# Patient Record
Sex: Male | Born: 1965 | Race: White | Hispanic: No | Marital: Single | Smoking: Never smoker
Health system: Southern US, Community
[De-identification: ages and names within clinical notes are randomized; demographics above are authoritative.]

## PROBLEM LIST (undated history)

## (undated) DIAGNOSIS — R42 Dizziness and giddiness: Secondary | ICD-10-CM

## (undated) DIAGNOSIS — F419 Anxiety disorder, unspecified: Secondary | ICD-10-CM

## (undated) DIAGNOSIS — K7460 Unspecified cirrhosis of liver: Secondary | ICD-10-CM

## (undated) DIAGNOSIS — I10 Essential (primary) hypertension: Secondary | ICD-10-CM

---

## 2014-02-25 ENCOUNTER — Emergency Department: Payer: Self-pay | Admitting: Emergency Medicine

## 2014-10-17 ENCOUNTER — Inpatient Hospital Stay
Admission: EM | Admit: 2014-10-17 | Discharge: 2014-10-19 | DRG: 434 | Disposition: A | Discharge status: Home / Self Care | Payer: BLUE CROSS/BLUE SHIELD | Attending: Internal Medicine | Admitting: Internal Medicine

## 2014-10-17 ENCOUNTER — Emergency Department: Payer: BLUE CROSS/BLUE SHIELD

## 2014-10-17 ENCOUNTER — Encounter: Payer: Self-pay | Admitting: Emergency Medicine

## 2014-10-17 DIAGNOSIS — F419 Anxiety disorder, unspecified: Secondary | ICD-10-CM | POA: Diagnosis present

## 2014-10-17 DIAGNOSIS — Z800 Family history of malignant neoplasm of digestive organs: Secondary | ICD-10-CM | POA: Diagnosis not present

## 2014-10-17 DIAGNOSIS — Z8249 Family history of ischemic heart disease and other diseases of the circulatory system: Secondary | ICD-10-CM

## 2014-10-17 DIAGNOSIS — Z79899 Other long term (current) drug therapy: Secondary | ICD-10-CM | POA: Diagnosis not present

## 2014-10-17 DIAGNOSIS — F1010 Alcohol abuse, uncomplicated: Secondary | ICD-10-CM | POA: Diagnosis present

## 2014-10-17 DIAGNOSIS — K8020 Calculus of gallbladder without cholecystitis without obstruction: Secondary | ICD-10-CM | POA: Diagnosis present

## 2014-10-17 DIAGNOSIS — K7460 Unspecified cirrhosis of liver: Secondary | ICD-10-CM | POA: Diagnosis present

## 2014-10-17 DIAGNOSIS — D1803 Hemangioma of intra-abdominal structures: Secondary | ICD-10-CM | POA: Diagnosis present

## 2014-10-17 DIAGNOSIS — I10 Essential (primary) hypertension: Secondary | ICD-10-CM | POA: Diagnosis present

## 2014-10-17 DIAGNOSIS — K7031 Alcoholic cirrhosis of liver with ascites: Secondary | ICD-10-CM | POA: Diagnosis present

## 2014-10-17 DIAGNOSIS — D6959 Other secondary thrombocytopenia: Secondary | ICD-10-CM | POA: Diagnosis present

## 2014-10-17 DIAGNOSIS — R188 Other ascites: Secondary | ICD-10-CM | POA: Diagnosis present

## 2014-10-17 DIAGNOSIS — F4010 Social phobia, unspecified: Secondary | ICD-10-CM | POA: Diagnosis present

## 2014-10-17 HISTORY — DX: Anxiety disorder, unspecified: F41.9

## 2014-10-17 HISTORY — DX: Unspecified cirrhosis of liver: K74.60

## 2014-10-17 HISTORY — DX: Dizziness and giddiness: R42

## 2014-10-17 HISTORY — DX: Essential (primary) hypertension: I10

## 2014-10-17 LAB — COMPREHENSIVE METABOLIC PANEL
ALBUMIN: 3.5 g/dL (ref 3.5–5.0)
ALT: 35 U/L (ref 17–63)
ALT: 32 U/L (ref 17–63)
AST: 86 U/L — AB (ref 15–41)
AST: 79 U/L — AB (ref 15–41)
Albumin: 3.9 g/dL (ref 3.5–5.0)
Alkaline Phosphatase: 134 U/L — ABNORMAL HIGH (ref 38–126)
Alkaline Phosphatase: 117 U/L (ref 38–126)
Anion gap: 10 (ref 5–15)
Anion gap: 9 (ref 5–15)
BUN: 7 mg/dL (ref 6–20)
BUN: 7 mg/dL (ref 6–20)
CHLORIDE: 104 mmol/L (ref 101–111)
CHLORIDE: 103 mmol/L (ref 101–111)
CO2: 24 mmol/L (ref 22–32)
CO2: 25 mmol/L (ref 22–32)
CREATININE: 0.69 mg/dL (ref 0.61–1.24)
CREATININE: 0.7 mg/dL (ref 0.61–1.24)
Calcium: 9.2 mg/dL (ref 8.9–10.3)
Calcium: 8.8 mg/dL — ABNORMAL LOW (ref 8.9–10.3)
GFR calc Af Amer: >60 mL/min (ref >60)
GFR calc non Af Amer: >60 mL/min (ref >60)
GFR calc non Af Amer: >60 mL/min (ref >60)
GLUCOSE: 98 mg/dL (ref 65–99)
Glucose, Bld: 104 mg/dL — ABNORMAL HIGH (ref 65–99)
Potassium: 3.7 mmol/L (ref 3.5–5.1)
Potassium: 3.8 mmol/L (ref 3.5–5.1)
SODIUM: 138 mmol/L (ref 135–145)
SODIUM: 137 mmol/L (ref 135–145)
Total Bilirubin: 5.4 mg/dL — ABNORMAL HIGH (ref 0.3–1.2)
Total Bilirubin: 4.9 mg/dL — ABNORMAL HIGH (ref 0.3–1.2)
Total Protein: 7.1 g/dL (ref 6.5–8.1)
Total Protein: 6.5 g/dL (ref 6.5–8.1)

## 2014-10-17 LAB — CBC WITH DIFFERENTIAL/PLATELET
BASOS ABS: 0.1 10*3/uL (ref 0–0.1)
EOS ABS: 0.1 10*3/uL (ref 0–0.7)
HCT: 48 % (ref 40.0–52.0)
Hemoglobin: 16.2 g/dL (ref 13.0–18.0)
Lymphocytes Relative: 28 %
Lymphs Abs: 2.4 10*3/uL (ref 1.0–3.6)
MCH: 33.6 pg (ref 26.0–34.0)
MCHC: 33.7 g/dL (ref 32.0–36.0)
MCV: 99.8 fL (ref 80.0–100.0)
Monocytes Absolute: 1.2 10*3/uL — ABNORMAL HIGH (ref 0.2–1.0)
Monocytes Relative: 14 %
Neutro Abs: 4.8 10*3/uL (ref 1.4–6.5)
Neutrophils Relative %: 56 %
PLATELETS: 97 10*3/uL — AB (ref 150–440)
RBC: 4.81 MIL/uL (ref 4.40–5.90)
RDW: 14.9 % — ABNORMAL HIGH (ref 11.5–14.5)
WBC: 8.6 10*3/uL (ref 3.8–10.6)

## 2014-10-17 LAB — CBC
HCT: 47.3 % (ref 40.0–52.0)
Hemoglobin: 15.7 g/dL (ref 13.0–18.0)
MCH: 33.4 pg (ref 26.0–34.0)
MCHC: 33.3 g/dL (ref 32.0–36.0)
MCV: 100.4 fL — AB (ref 80.0–100.0)
PLATELETS: 92 10*3/uL — AB (ref 150–440)
RBC: 4.71 MIL/uL (ref 4.40–5.90)
RDW: 15 % — ABNORMAL HIGH (ref 11.5–14.5)
WBC: 8.9 10*3/uL (ref 3.8–10.6)

## 2014-10-17 LAB — ETHANOL

## 2014-10-17 LAB — LIPASE, BLOOD: Lipase: 51 U/L (ref 22–51)

## 2014-10-17 LAB — APTT: APTT: 39 s — AB (ref 24–36)

## 2014-10-17 LAB — PROTIME-INR
INR: 1.27
Prothrombin Time: 16.1 seconds — ABNORMAL HIGH (ref 11.4–15.0)

## 2014-10-17 LAB — MAGNESIUM: Magnesium: 1.9 mg/dL (ref 1.7–2.4)

## 2014-10-17 MED ORDER — OXYCODONE HCL 5 MG PO TABS
5.0000 mg | ORAL_TABLET | ORAL | Status: DC | PRN
  Administered 2014-10-17 – 2014-10-18 (×3): 5 mg via ORAL
  Filled 2014-10-17 (×3): qty 1

## 2014-10-17 MED ORDER — MORPHINE SULFATE (PF) 2 MG/ML IV SOLN: 2.0000 mg | INTRAVENOUS | Status: DC | PRN

## 2014-10-17 MED ORDER — ALPRAZOLAM 0.25 MG PO TABS
0.2500 mg | ORAL_TABLET | Freq: Once | ORAL | Status: AC
  Administered 2014-10-17: 0.25 mg via ORAL

## 2014-10-17 MED ORDER — ALPRAZOLAM 0.25 MG PO TABS
0.2500 mg | ORAL_TABLET | Freq: Every evening | ORAL | Status: DC | PRN
  Filled 2014-10-17: qty 1

## 2014-10-17 MED ORDER — FUROSEMIDE 40 MG PO TABS
40.0000 mg | ORAL_TABLET | Freq: Every day | ORAL | Status: DC
  Administered 2014-10-18 – 2014-10-19 (×2): 40 mg via ORAL
  Filled 2014-10-17 (×3): qty 1

## 2014-10-17 MED ORDER — FOLIC ACID 1 MG PO TABS
1.0000 mg | ORAL_TABLET | Freq: Every day | ORAL | Status: DC
  Administered 2014-10-18 – 2014-10-19 (×2): 1 mg via ORAL
  Filled 2014-10-17 (×3): qty 1

## 2014-10-17 MED ORDER — LOSARTAN POTASSIUM 25 MG PO TABS
25.0000 mg | ORAL_TABLET | Freq: Every day | ORAL | Status: DC
Start: 2014-10-17 — End: 2014-10-18
  Administered 2014-10-18: 25 mg via ORAL
  Filled 2014-10-17 (×2): qty 1

## 2014-10-17 MED ORDER — ADULT MULTIVITAMIN W/MINERALS CH
1.0000 | ORAL_TABLET | Freq: Every day | ORAL | Status: DC
Start: 2014-10-17 — End: 2014-10-19
  Administered 2014-10-18 – 2014-10-19 (×2): 1 via ORAL
  Filled 2014-10-17 (×2): qty 1

## 2014-10-17 MED ORDER — IOHEXOL 240 MG/ML SOLN
25.0000 mL | Freq: Once | INTRAMUSCULAR | Status: AC | PRN
  Administered 2014-10-17: 25 mL via ORAL

## 2014-10-17 MED ORDER — IOHEXOL 300 MG/ML  SOLN
100.0000 mL | Freq: Once | INTRAMUSCULAR | Status: AC | PRN
  Administered 2014-10-17: 100 mL via INTRAVENOUS

## 2014-10-17 MED ORDER — ALPRAZOLAM 0.5 MG PO TABS
ORAL_TABLET | ORAL | Status: AC
  Administered 2014-10-17: 0.25 mg via ORAL
  Filled 2014-10-17: qty 1

## 2014-10-17 MED ORDER — SENNOSIDES-DOCUSATE SODIUM 8.6-50 MG PO TABS: 1.0000 | ORAL_TABLET | Freq: Every evening | ORAL | Status: DC | PRN

## 2014-10-17 NOTE — Progress Notes (Signed)
Pt just arrived on floor. Rating pain 6/10 lower abdominal pain r/t ascites diagnosis. No pain medication ordered. On call MD notified, acknowledged, & will place order for PRN pain medication.

## 2014-10-17 NOTE — ED Notes (Signed)
MD at bedside. 

## 2014-10-17 NOTE — ED Notes (Signed)
Brought over from [facility] with abd swelling ..swelling noted on sat

## 2014-10-17 NOTE — H&P (Signed)
[facility] [place] NAME: Ronnie Cohen    MR#:  [id number]  DATE OF BIRTH:  1965/12/25  DATE OF ADMISSION:  10/17/2014  PRIMARY CARE PHYSICIAN: Pcp Not In System   REQUESTING/REFERRING PHYSICIAN: Dr.Forbach  CHIEF COMPLAINT: Increased abdominal bloating, jaundice.    Chief Complaint  Patient presents with  . Abdominal Pain    HISTORY OF PRESENT ILLNESS:  Ronnie Cohen  is a 49 y.o. male with a known history of hypertension, anxiety brought in by the family secondary to abdominal bloating, worsening the leg swelling, weight gain of 20 pounds in 2-3 weeks. According to the patient he has been having the abdominal swelling since January but got worse recently. Patient went to Dr. Gustavo Lah 's office yesterday he did ultrasound of abdomen which showed hepatic cirrhosis and ascites multiple gallstones and a nodule in the left hepatic lobe likely hemangioma. Concerning the ascites jaundice weight gain family brought him in here. Patient denies abdominal pain. Does have some nausea today. No fever. No recent travel. Patient is uncomfortable because of fall abdominal bloating and stretching. Patient tried some icy hot to help with this. Patient has no black stools. Denies any fever. No night sweats, chills, dysuria. Patient went to Dr. Gustavo Lah  office and he was sent in here for further evaluation.  PAST MEDICAL HISTORY:   Past Medical History  Diagnosis Date  . Hypertension   . Anxiety   . Vertigo     PAST SURGICAL HISTOIRY:  History reviewed. No pertinent past surgical history.  SOCIAL HISTORY:   Social History  Substance Use Topics  . Smoking status: Never Smoker   . Smokeless tobacco: Not on file  . Alcohol Use: Yes    FAMILY HISTORY:  No family history on file.  DRUG ALLERGIES:  No Known Allergies  REVIEW OF SYSTEMS:  CONSTITUTIONAL: No fever, fatigue or weakness.  EYES: No blurred or double vision.  EARS, NOSE, AND  THROAT: No tinnitus or ear pain.  RESPIRATORY: No cough, shortness of breath, wheezing or hemoptysis.  CARDIOVASCULAR: No chest pain, orthopnea, edema.  GASTROINTESTINAL ascites, nausea, weight gain, pedal edema present GENITOURINARY: No dysuria, hematuria.  ENDOCRINE: No polyuria, nocturia,  HEMATOLOGY: No anemia, easy bruising or bleeding SKIN: No rash or lesion. MUSCULOSKELETAL: No joint pain or arthritis.   NEUROLOGIC: No tingling, numbness, weakness.  PSYCHIATRY: No anxiety or depression.   MEDICATIONS AT HOME:   Prior to Admission medications   Medication Sig Start Date End Date Taking? Authorizing Provider  ALPRAZolam Duanne Moron) 0.25 MG tablet Take 1 tablet by mouth daily as needed. 10/10/14  Yes Historical Provider, MD  losartan (COZAAR) 25 MG tablet Take 1 tablet by mouth daily. 10/04/14  Yes Historical Provider, MD  Multiple Vitamin (MULTIVITAMIN WITH MINERALS) TABS tablet Take 1 tablet by mouth daily.   Yes Historical Provider, MD      VITAL SIGNS:  Blood pressure 136/98, pulse 102, temperature 98.8 F (37.1 C), temperature source Oral, resp. rate 20, height 6\' 1"  (1.854 m), weight 123.378 kg (272 lb), SpO2 93 %.  PHYSICAL EXAMINATION:  GENERAL:  49 y.o.-year-old patient lying in the bed with no acute distress.  EYES: Pupils equal, round, reactive to light and accommodation. Icterus present Extraocular muscles intact.  HEENT: Head atraumatic, normocephalic. Oropharynx and nasopharynx clear.  NECK:  Supple, no jugular venous distention. No thyroid enlargement, no tenderness.  LUNGS: Diminished Breath sounds bilaterally.  CARDIOVASCULAR: S1, S2 normal. No murmurs, rubs, or gallops.  ABDOMEN: Ascites, fluid thrill present no tenderness to palpation.Marland Kitchen  EXTREMITIES: No pedal edema, cyanosis, or clubbing.  NEUROLOGIC: Cranial nerves II through XII are intact. Muscle strength 5/5 in all extremities. Sensation intact. Gait not checked.  PSYCHIATRIC: The patient is alert and oriented  x 3.  SKIN: No obvious rash, lesion, or ulcer.   LABORATORY PANEL:   CBC  Recent Labs Lab 10/17/14 1449  WBC 8.6  HGB 16.2  HCT 48.0  PLT 97*   ------------------------------------------------------------------------------------------------------------------  Chemistries   Recent Labs Lab 10/17/14 1449  NA 138  K 3.7  CL 104  CO2 24  GLUCOSE 104*  BUN 7  CREATININE 0.69  CALCIUM 9.2  MG 1.9  AST 86*  ALT 35  ALKPHOS 134*  BILITOT 5.4*   ------------------------------------------------------------------------------------------------------------------  Cardiac Enzymes No results for input(s): TROPONINI in the last 168 hours. ------------------------------------------------------------------------------------------------------------------  RADIOLOGY:  Ct Abdomen Pelvis W Contrast  10/17/2014   CLINICAL DATA:  Generalized abdominal distention and swelling since Saturday.  EXAM: CT ABDOMEN AND PELVIS WITH CONTRAST  TECHNIQUE: Multidetector CT imaging of the abdomen and pelvis was performed using the standard protocol following bolus administration of intravenous contrast.  CONTRAST:  113mL OMNIPAQUE IOHEXOL 300 MG/ML  SOLN  COMPARISON:  None.  FINDINGS: The there is moderate to marked ascites in the abdomen and pelvis.  No focal liver lesion is identified. The spleen, pancreas, adrenal glands and kidneys are normal. There is a gallstone in the gallbladder. The aorta is normal without aneurysmal dilatation. There is no abdominal lymphadenopathy. There is no small bowel obstruction or diverticulitis. There is mild umbilical herniation of ascites and mesenteric fat.  Partial fluid-filled bladder is normal. Ascites is identified in the pelvis. There is mild atelectasis of the bilateral lung bases. Degenerative joint changes of the spine are noted.  IMPRESSION: Moderate-to-marked ascites in the abdomen and pelvis.  No other acute abnormalities identified in the abdomen and pelvis.    Electronically Signed   By: Abelardo Diesel M.D.   On: 10/17/2014 17:25    EKG:  No orders found for this or any previous visit.  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 But to rule out other causes like hepatitis, Primary biliary cirrhosis  ,autoimmune liver disease. Admit him to hospitalist service, GI consult requested:, #2 ascites: Interventional radiology to help with paracentesis for diagnostic, therapeutic paracentesis. I don't think he has SBP at this time hold off on Antibiotics.. #3 history of alcohol abuse patient says that he does not drink heavily right now he is not having any withdrawal, last drink was last Friday. #4 anxiety continue Xanax. #5 hypertension patient is on losartan restarted that. T thrombocytopenia secondary to cirrhosis. All the records are reviewed and case discussed with ED provider. Management plans discussed with the patient, family and they are in agreement.  CODE STATUS: full  TOTAL TIME TAKING CARE OF THIS PATIENT: *55 minutes.    Epifanio Lesches M.D on 10/17/2014 at 7:12 PM  Between 7am to 6pm - Pager - [phone]  After 6pm go to [email or web address] - password [email or web address] [place]  Office  [phone]  CC: Primary care physician; Pcp Not In System

## 2014-10-17 NOTE — ED Provider Notes (Signed)
[facility] Emergency Department Provider Note  ____________________________________________  Time seen: Approximately 2:13 PM  I have reviewed the triage vital signs and the nursing notes.   HISTORY  Chief Complaint Abdominal Pain    HPI Ronnie Cohen is a 49 y.o. male with a history of hypertension, anxiety, and regular moderate to heavy alcohol use who presents with substantial abdominal distention which has occurred all in the last 6 days.  It has been a gradual but rapid onset and is now severe.  He denies any pain other than the pain of the swollen abdomen, but he is not tender to palpation.  He has had no nausea, vomiting, chest pain, shortness of breath, or dysuria.  Never had symptoms like this in the past.  His primary care doctor is doctors making house calls and they originally ordered some labs and ultrasound.  His girlfriend helped him get in to see the [place] clinic earlier today and his labs were abnormal and his ultrasound had a nonspecific abnormality with hepatic cirrhosis and ascites.  Reportedly his total bilirubin was elevated substantially today.  He was sent to the emergency department for further evaluation.  The patient reports he drinks anywhere between 2-4 glasses of wine nightly and occasionally has additional beer on the weekends.  This is been consistent for years.  He recently started taking losartan for blood pressure about 1 month ago but has had no other medication changes.  He denies any recent fever or chills and generally feels the same except for his swollen abdomen.   Past Medical History  Diagnosis Date  . Hypertension   . Anxiety   . Vertigo     Patient Active Problem List   Diagnosis Date Noted  . Ascites 10/17/2014    History reviewed. No pertinent past surgical history.  No current outpatient prescriptions on file.  Allergies Review of patient's allergies indicates no known allergies.  Family History   Problem Relation Age of Onset  . Hypertension Father     Social History Social History  Substance Use Topics  . Smoking status: Never Smoker   . Smokeless tobacco: None  . Alcohol Use: 15.0 oz/week    25 Standard drinks or equivalent per week    Review of Systems Constitutional: No fever/chills Eyes: No visual changes. ENT: No sore throat. Cardiovascular: Denies chest pain. Respiratory: Denies shortness of breath. Gastrointestinal: No abdominal pain.  Severe abdominal swelling.  No nausea, no vomiting.  No diarrhea.  No constipation. Genitourinary: Negative for dysuria. Musculoskeletal: Negative for back pain. Skin: Negative for rash. Neurological: Negative for headaches, focal weakness or numbness.  10-point ROS otherwise negative.  ____________________________________________   PHYSICAL EXAM:  VITAL SIGNS: ED Triage Vitals  Enc Vitals Group     BP 10/17/14 1351 136/98 mmHg     Pulse Rate 10/17/14 1351 102     Resp 10/17/14 1351 20     Temp 10/17/14 1351 98.8 F (37.1 C)     Temp Source 10/17/14 1351 Oral     SpO2 10/17/14 1351 93 %     Weight 10/17/14 1351 272 lb (123.378 kg)     Height 10/17/14 1351 6\' 1"  (1.854 m)     Head Cir --      Peak Flow --      Pain Score 10/17/14 1353 2     Pain Loc --      Pain Edu? --      Excl. in [place]? --  Constitutional: Alert and oriented. Well appearing and in no acute distress. Eyes: Conjunctivae are normal. PERRL. EOMI. Head: Atraumatic. Nose: No congestion/rhinnorhea. Mouth/Throat: Mucous membranes are moist.  Oropharynx non-erythematous. Neck: No stridor.   Cardiovascular: Normal rate, regular rhythm. Grossly normal heart sounds.  Good peripheral circulation. Respiratory: Normal respiratory effort.  No retractions. Lungs CTAB. Gastrointestinal: Firm, distended abdomen with dullness to percussion.  No tenderness to palpation. Musculoskeletal: No lower extremity tenderness nor edema.  No joint  effusions. Neurologic:  Normal speech and language. No gross focal neurologic deficits are appreciated.  Skin:  Skin is warm, dry and intact. No rash noted. Psychiatric: Mood and affect are normal. Speech and behavior are normal.  ____________________________________________   LABS (all labs ordered are listed, but only abnormal results are displayed)  Labs Reviewed  CBC WITH DIFFERENTIAL/PLATELET - Abnormal; Notable for the following:    RDW 14.9 (*)    Platelets 97 (*)    Monocytes Absolute 1.2 (*)    All other components within normal limits  COMPREHENSIVE METABOLIC PANEL - Abnormal; Notable for the following:    Glucose, Bld 104 (*)    AST 86 (*)    Alkaline Phosphatase 134 (*)    Total Bilirubin 5.4 (*)    All other components within normal limits  PROTIME-INR - Abnormal; Notable for the following:    Prothrombin Time 16.1 (*)    All other components within normal limits  APTT - Abnormal; Notable for the following:    aPTT 39 (*)    All other components within normal limits  CBC - Abnormal; Notable for the following:    MCV 100.4 (*)    RDW 15.0 (*)    Platelets 92 (*)    All other components within normal limits  COMPREHENSIVE METABOLIC PANEL - Abnormal; Notable for the following:    Calcium 8.8 (*)    AST 79 (*)    Total Bilirubin 4.9 (*)    All other components within normal limits  LIPASE, BLOOD  ETHANOL  MAGNESIUM  HEPATITIS B SURFACE ANTIGEN  HEPATITIS PANEL, ACUTE   ____________________________________________  EKG  Not indicated ____________________________________________  RADIOLOGY  Ct Abdomen Pelvis W Contrast  10/17/2014   CLINICAL DATA:  Generalized abdominal distention and swelling since Saturday.  EXAM: CT ABDOMEN AND PELVIS WITH CONTRAST  TECHNIQUE: Multidetector CT imaging of the abdomen and pelvis was performed using the standard protocol following bolus administration of intravenous contrast.  CONTRAST:  157mL OMNIPAQUE IOHEXOL 300 MG/ML   SOLN  COMPARISON:  None.  FINDINGS: The there is moderate to marked ascites in the abdomen and pelvis.  No focal liver lesion is identified. The spleen, pancreas, adrenal glands and kidneys are normal. There is a gallstone in the gallbladder. The aorta is normal without aneurysmal dilatation. There is no abdominal lymphadenopathy. There is no small bowel obstruction or diverticulitis. There is mild umbilical herniation of ascites and mesenteric fat.  Partial fluid-filled bladder is normal. Ascites is identified in the pelvis. There is mild atelectasis of the bilateral lung bases. Degenerative joint changes of the spine are noted.  IMPRESSION: Moderate-to-marked ascites in the abdomen and pelvis.  No other acute abnormalities identified in the abdomen and pelvis.   Electronically Signed   By: Abelardo Diesel M.D.   On: 10/17/2014 17:25     ____________________________________________   PROCEDURES  Procedure(s) performed: None  Critical Care performed: No ____________________________________________   INITIAL IMPRESSION / ASSESSMENT AND PLAN / ED COURSE  Pertinent labs & imaging results  that were available during my care of the patient were reviewed by me and considered in my medical decision making (see chart for details).  Given the rapid onset of the symptoms I feel it is important to obtain a CT of his abdomen to look both for neoplastic disease as well as other possible causes such as portal vein thrombosis.  He is nontoxic and in no acute distress and I am not concerned for spontaneous bacterial peritonitis at this time.  I anticipate admission for large volume paracentesis and further GI workup.  ----------------------------------------- 4:42 PM on 10/17/2014 -----------------------------------------  The patient's labs are notable for significantly elevated total bilirubin and slightly elevated coagulation studies.  Going for CT scan now.  Dr. Gustavo Lah called to discuss the case with me  because his 84 assistant was the one who saw the patient in clinic.  He agreed that admission is necessary and he agreed with my plan for the CT scan to rule out other possibilities.  He suggested that the hospitalist put in a consult for him and his 27 assistant would put a note in the computer as well.  ----------------------------------------- 5:44 PM on 10/17/2014 -----------------------------------------  The patient's CT scan was generally unremarkable with no evidence of thrombosis or other emergent condition.  However, he has a total bilirubin of 5.4 and as per my discussion with Dr. Gustavo Lah I will admit him to the hospitalist for GI consult and further evaluation as well as for a large volume paracentesis and evaluation of the ascites.  ____________________________________________  FINAL CLINICAL IMPRESSION(S) / ED DIAGNOSES  Final diagnoses:  Ascites  Hyperbilirubinemia      NEW MEDICATIONS STARTED DURING THIS VISIT:  Current Discharge Medication List       Hinda Kehr, MD 10/17/14 2034

## 2014-10-18 ENCOUNTER — Inpatient Hospital Stay: Payer: BLUE CROSS/BLUE SHIELD

## 2014-10-18 ENCOUNTER — Encounter: Payer: Self-pay | Admitting: Student

## 2014-10-18 DIAGNOSIS — K7460 Unspecified cirrhosis of liver: Secondary | ICD-10-CM

## 2014-10-18 HISTORY — DX: Unspecified cirrhosis of liver: K74.60

## 2014-10-18 LAB — BODY FLUID CELL COUNT WITH DIFFERENTIAL
EOS FL: 0 %
Lymphs, Fluid: 34 %
MONOCYTE-MACROPHAGE-SEROUS FLUID: 59 % (ref 50–90)
Neutrophil Count, Fluid: 7 % (ref 0–25)
Other Cells, Fluid: 0 %
Total Nucleated Cell Count, Fluid: 280 cu mm (ref 0–1000)

## 2014-10-18 LAB — PROTEIN, BODY FLUID

## 2014-10-18 LAB — GLUCOSE, SEROUS FLUID: Glucose, Fluid: 115 mg/dL

## 2014-10-18 MED ORDER — VITAMIN B-1 100 MG PO TABS
100.0000 mg | ORAL_TABLET | Freq: Every day | ORAL | Status: DC
  Administered 2014-10-18 – 2014-10-19 (×2): 100 mg via ORAL
  Filled 2014-10-18 (×2): qty 1

## 2014-10-18 MED ORDER — SPIRONOLACTONE 25 MG PO TABS
50.0000 mg | ORAL_TABLET | Freq: Every day | ORAL | Status: DC
  Administered 2014-10-18 – 2014-10-19 (×2): 50 mg via ORAL
  Filled 2014-10-18 (×2): qty 2

## 2014-10-18 NOTE — Plan of Care (Signed)
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  Hx HTN, anxiety, & vertigo controlled by home medications.  Low fall risk. Pt safely ambulates in room independently. Pt understands how to use call system for assistance.

## 2014-10-18 NOTE — Plan of Care (Signed)
Problem: Discharge Progression Outcomes Goal: Other Discharge Outcomes/Goals Outcome: Progressing VSS. Oxycodon given once for abd pain with good effect. Denies n/v. Tolerates diet. Paracentesis done today, 6L removed. Dressing on puncture site dry and intact.

## 2014-10-18 NOTE — Plan of Care (Signed)
Problem: Discharge Progression Outcomes Goal: Other Discharge Outcomes/Goals Outcome: Progressing Plan of care progress to goals: 1. C/o abdominal pain relieved by PRN Oxycodone.  2. Hemodynamically:              -VSS, afebrile              -abdomen tight/distended, non-tender. Paracentesis scheduled for today.              -Total Bilirubin 4.9             -GI consulted.  3. Tolerated heart healthy diet well. Has been NPO since midnight for paracentesis 4. Standby assist for safety due to getting pain medications. Pt understands how to use call system for assistance.

## 2014-10-18 NOTE — Progress Notes (Signed)
Patient ID: Ronnie Cohen, male   DOB: 1965/10/04, 49 y.o.   MRN: [id number] [facility] PROGRESS NOTE  PCP: Pcp Not In System  HPI/Subjective: Patient with abdominal swelling going on and off for a while. Because of social anxiety disorder he does not leave the house for a much. He was able to go see Dr. Gustavo Lah [facility] and was referred into the hospital because of ascites  Objective: Filed Vitals:   10/18/14 1438  BP: 121/79  Pulse: 66  Temp:   Resp: 18    Filed Weights   10/17/14 1351 10/17/14 2010 10/18/14 1127  Weight: 123.378 kg (272 lb) 122.29 kg (269 lb 9.6 oz) 120.855 kg (266 lb 7 oz)    ROS: Review of Systems  Constitutional: Negative for fever and chills.  Eyes: Negative for blurred vision.  Respiratory: Positive for shortness of breath. Negative for cough.   Cardiovascular: Negative for chest pain.  Gastrointestinal: Positive for abdominal pain. Negative for nausea, vomiting, diarrhea and constipation.  Genitourinary: Negative for dysuria.  Musculoskeletal: Negative for joint pain.  Neurological: Negative for dizziness and headaches.   Exam: Physical Exam  Constitutional: He is oriented to person, place, and time.  HENT:  Nose: No mucosal edema.  Mouth/Throat: No oropharyngeal exudate or posterior oropharyngeal edema.  Eyes: Conjunctivae, EOM and lids are normal. Pupils are equal, round, and reactive to light.  Neck: No JVD present. Carotid bruit is not present. No edema present. No thyroid mass and no thyromegaly present.  Cardiovascular: S1 normal and S2 normal.  Exam reveals no gallop.   No murmur heard. Pulses:      Dorsalis pedis pulses are 2+ on the right side, and 2+ on the left side.  Respiratory: No respiratory distress. He has no wheezes. He has no rhonchi. He has no rales.  GI: Soft. Bowel sounds are normal. He exhibits distension and fluid wave. There is no tenderness.  Musculoskeletal:       Right ankle: He  exhibits swelling.       Left ankle: He exhibits swelling.  Lymphadenopathy:    He has no cervical adenopathy.  Neurological: He is alert and oriented to person, place, and time. No cranial nerve deficit.  Skin: Skin is warm. No rash noted. Nails show no clubbing.  Psychiatric: He has a normal mood and affect.    Data Reviewed: Basic Metabolic Panel:  Recent Labs Lab 10/17/14 1449 10/17/14 1924  NA 138 137  K 3.7 3.8  CL 104 103  CO2 24 25  GLUCOSE 104* 98  BUN 7 7  CREATININE 0.69 0.70  CALCIUM 9.2 8.8*  MG 1.9  --    Liver Function Tests:  Recent Labs Lab 10/17/14 1449 10/17/14 1924  AST 86* 79*  ALT 35 32  ALKPHOS 134* 117  BILITOT 5.4* 4.9*  PROT 7.1 6.5  ALBUMIN 3.9 3.5    Recent Labs Lab 10/17/14 1449  LIPASE 51   CBC:  Recent Labs Lab 10/17/14 1449 10/17/14 1924  WBC 8.6 8.9  NEUTROABS 4.8  --   HGB 16.2 15.7  HCT 48.0 47.3  MCV 99.8 100.4*  PLT 97* 92*    Studies: Ct Abdomen Pelvis W Contrast  10/17/2014   CLINICAL DATA:  Generalized abdominal distention and swelling since Saturday.  EXAM: CT ABDOMEN AND PELVIS WITH CONTRAST  TECHNIQUE: Multidetector CT imaging of the abdomen and pelvis was performed using the standard protocol following bolus administration of intravenous contrast.  CONTRAST:  181mL OMNIPAQUE IOHEXOL 300 MG/ML  SOLN  COMPARISON:  None.  FINDINGS: The there is moderate to marked ascites in the abdomen and pelvis.  No focal liver lesion is identified. The spleen, pancreas, adrenal glands and kidneys are normal. There is a gallstone in the gallbladder. The aorta is normal without aneurysmal dilatation. There is no abdominal lymphadenopathy. There is no small bowel obstruction or diverticulitis. There is mild umbilical herniation of ascites and mesenteric fat.  Partial fluid-filled bladder is normal. Ascites is identified in the pelvis. There is mild atelectasis of the bilateral lung bases. Degenerative joint changes of the spine are  noted.  IMPRESSION: Moderate-to-marked ascites in the abdomen and pelvis.  No other acute abnormalities identified in the abdomen and pelvis.   Electronically Signed   By: Abelardo Diesel M.D.   On: 10/17/2014 17:25   US Paracentesis  10/18/2014   CLINICAL DATA:  Ascites.  EXAM: ULTRASOUND GUIDED PARACENTESIS  COMPARISON:  CT scan of October 17, 2014.  PROCEDURE: An ultrasound guided paracentesis was thoroughly discussed with the patient and questions answered. The benefits, risks, alternatives and complications were also discussed. The patient understands and wishes to proceed with the procedure. Written consent was obtained.  Ultrasound was performed to localize and mark an adequate pocket of fluid in the right lower quadrant of the abdomen. The area was then prepped and draped in the normal sterile fashion. 1% Lidocaine was used for local anesthesia. Under ultrasound guidance a Safe-T-Centesis catheter was introduced. Paracentesis was performed. The catheter was removed and a dressing applied.  COMPLICATIONS: None immediate.  FINDINGS: A total of approximately 6 L of serous fluid was removed. A fluid sample was sent for laboratory analysis.  IMPRESSION: Successful ultrasound guided paracentesis yielding 6 L of ascites.   Electronically Signed   By: Marijo Conception, M.D.   On: 10/18/2014 15:07    Scheduled Meds: . folic acid  1 mg Oral Daily  . furosemide  40 mg Oral Daily  . losartan  25 mg Oral Daily  . multivitamin with minerals  1 tablet Oral Daily    Assessment/Plan:  1. 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 Hepatitis profile still pending. I added on an ANA and AMA. Patient had 6 L with a paracentesis drawn off today awaiting studies back from that. I will add Aldactone 50 mg daily and continue Lasix 40 mg daily. Goal of Aldactone 100 mg daily. Will need follow-up with GI as outpatient. Potential discharge tomorrow. Of note, the radiologist did not read the CT scan as cirrhosis of  liver and did not mention anything about the characteristics of the liver except for no masses. 2. Essential hypertension- I will use Lasix and Aldactone for blood pressure and get rid of the losartan. 3. Alcohol abuse advised to stop drinking. 4. Social anxiety disorder- not on any medications for this  Code Status:     Code Status Orders        Start     Ordered   10/17/14 1857  Full code   Continuous     10/17/14 1901     Family Communication: Given permission to speak in front of the people in the room. Disposition Plan: Potential home tomorrow  Consultants:  Gastroenterology  Procedures:  Paracentesis  Time spent: 35 minutes  Loletha Grayer  [facility]

## 2014-10-18 NOTE — Progress Notes (Signed)
Initial Nutrition Assessment   INTERVENTION:   Meals and Snacks: Cater to patient preferences as per N[facility] radiology reported that pt could have po prior to paracentesis Coordination of Care: will recommend new weight after paracentesis   NUTRITION DIAGNOSIS:   Inadequate oral intake related to poor appetite as evidenced by per patient/family report.  GOAL:   Patient will meet greater than or equal to 90% of their needs  MONITOR:    (Energy Intake, Anthropometrics, digestive system)  REASON FOR ASSESSMENT:   Diagnosis    ASSESSMENT:   Pt admitted with ascites secondary to hepatic cirrhosis on u/s outpatient with gallstones and nodule on hepatic lobe per MD note. Pt scheduled for paracentesis today.  Past Medical History  Diagnosis Date  . Hypertension   . Anxiety   . Vertigo     Diet Order:  Diet 2 gram sodium Room service appropriate?: Yes; Fluid consistency:: Thin    Current Nutrition: Pt had just received breakfast tray this am on visit  Food/Nutrition-Related History: Pt reports poor appetite for the past week secondary to bloated feeling on abdomen. Pt reports still trying to eat some as able.   Medications: folic acid, lasix, MVI  Electrolyte/Renal Profile and Glucose Profile:   Recent Labs Lab 10/17/14 1449 10/17/14 1924  NA 138 137  K 3.7 3.8  CL 104 103  CO2 24 25  BUN 7 7  CREATININE 0.69 0.70  CALCIUM 9.2 8.8*  MG 1.9  --   GLUCOSE 104* 98   Protein Profile:  Recent Labs Lab 10/17/14 1449 10/17/14 1924  ALBUMIN 3.9 3.5    Gastrointestinal Profile: Last BM:  10/17/2014   Nutrition-Focused Physical Exam Findings:  Unable to complete Nutrition-Focused physical exam at this time.    Weight Change: Pt reports UBW of 245lbs.    Skin:  Reviewed, no issues   Height:   Ht Readings from Last 1 Encounters:  10/17/14 6\' 1"  (1.854 m)    Weight:   Wt Readings from Last 1 Encounters:  10/18/14 266 lb 7 oz (120.855 kg)    Ideal  Body Weight:   84kg  BMI:  Body mass index is 35.16 kg/(m^2).  Estimated Nutritional Needs:   Kcal:  BEE: 1764kcals, TEE: (IF 1.0-1.2)(AF 1.3) 2293-2751kcals, using IBW of 84kg  Protein:  92-109g protein (1.1-1.3g/kg) using IBW of 84kg  Fluid:  2100-2554mL of fluid (25-58mL/kg) using IBW of 84kg  EDUCATION NEEDS:   No education needs identified at this time    Elysian, RD, LDN Pager [phone]

## 2014-10-18 NOTE — Progress Notes (Signed)
GI Note:  Agree with Ronnie Cohen's a/p.  New onset ascites over past month or so.   Labs are suggestive of cirrhosis and does have ETOH hx.    Recs; - serologic w/u to eval for other causes of liver disease but likely ETOH cirrhosis - LVP with cytology on ascites fluid - 1.5 gr Na diet - start lasix 40, spiro 100 daily - met-c in one week as outpatient and f/u in GI clinic in 2 weeks.

## 2014-10-18 NOTE — Progress Notes (Signed)
Notified Dr Leslye Peer that pt has been NPO for US paracentesis. Per radiology pt do not need to be NPO. MD verbalized to order 2gram sodium diet.

## 2014-10-18 NOTE — Procedures (Signed)
Informed consent obtained. Under US guidance, paracentesis was performed. No immediate complication.

## 2014-10-18 NOTE — Consult Note (Signed)
GI Inpatient Consult Note  Reason for Consult: New cirrhosis, jaundice, ascities   Attending Requesting Consult: Konidena  History of Present Illness: Ronnie Cohen is a 49 y.o. male with a h/o HTN and anxiety admitted for new cirrhosis, jaundice, and ascites.  He presented to [facility] yesterday and saw Ronnie Cohen, [place] for 30 lb weight gain in the last 2 week.  Per patient's girlfriend, she noticed yellowing of his eyes over the last week.  Prior to his appointment, abdominal US ordered by PCP revealed enlarged liver measuring 4mm in length, hepatic cirrhosis with ascites. There was also evidence of cholelithiasis with possible chronic cholecystitis, no CBD dilitation. An abdominal xray was unremarkable.  He was escorted to the ED by GI PA and admitted for further evaluation.  Today, Ronnie Cohen reports no new symptoms.  He is feeling pretty well except for abdominal discomfort d/t large volume ascites.  He reports LE swelling as well, but denies additional liver-related problems including jaundice, itching, confusion, rectal bleeding, and dark stools.  Patient does endorses drinking 2-4 glasses of wine a night to help with his anxiety and sleep.  He has done so for the past 8-10 years.No history of tattoos, blood transfusions, or IVDU.  He takes a ASA 325mg  daily and Advil prn for pain. Patient also reports using various OTC weight loss products and nutrition/performance enhancing supplements.   Patient also reports 4-5 episodes of diarrhea on Saturday, which resolved w/o recurrence. He denies nausea, vomiting, mucoid stools, hematochezia, and melena.  No additional GI symptoms including fever, chills, dysphagia, reflux, and constipation.  Of note, there was an 36mm echogenic nodule in left hepatic lobe c/w hemangioma on [place], not present on CT scan.   Past Medical History:  Past Medical History  Diagnosis Date  . Hypertension   . Anxiety   . Vertigo     Problem  List: Patient Active Problem List   Diagnosis Date Noted  . Ascites 10/17/2014    Past Surgical History: History reviewed. No pertinent past surgical history.  Allergies: No Known Allergies  Home Medications: Prescriptions prior to admission  Medication Sig Dispense Refill Last Dose  . ALPRAZolam (XANAX) 0.25 MG tablet Take 1 tablet by mouth daily as needed.  3 10/17/2014 at Unknown time  . losartan (COZAAR) 25 MG tablet Take 1 tablet by mouth daily.   10/17/2014 at Unknown time  . Multiple Vitamin (MULTIVITAMIN WITH MINERALS) TABS tablet Take 1 tablet by mouth daily.   Past Week at Unknown time   Home medication reconciliation was completed with the patient.   Scheduled Inpatient Medications:   . folic acid  1 mg Oral Daily  . furosemide  40 mg Oral Daily  . losartan  25 mg Oral Daily  . multivitamin with minerals  1 tablet Oral Daily    Continuous Inpatient Infusions:     PRN Inpatient Medications:  ALPRAZolam, morphine injection, oxyCODONE, senna-docusate  Family History: family history includes Hypertension in his father.  Family history is significant for colon cancer in MGM, no liver disease, autoimmune disease, or GI malignancy.  Social History:   reports that he has never smoked. He does not have any smokeless tobacco history on file. He reports that he drinks about 15.0 oz of alcohol per week.   Review of Systems: Constitutional: Weight is stable.  Eyes: No changes in vision. ENT: No oral lesions, sore throat.  GI: see HPI.  Heme/Lymph: No easy bruising.  CV: No chest pain.  GU: No hematuria.  Integumentary: No rashes.  Neuro: No headaches.  Psych: No depression/anxiety.  Endocrine: No heat/cold intolerance.  Allergic/Immunologic: No urticaria.  Resp: No cough, SOB.  Musculoskeletal: No joint swelling.    Physical Examination: BP 137/88 mmHg  Pulse 82  Temp(Src) 99 F (37.2 C) (Oral)  Resp 18  Ht 6\' 1"  (1.854 m)  Wt 120.855 kg (266 lb 7 oz)  BMI  35.16 kg/m2  SpO2 94% Gen: NAD, alert and oriented x 4 HEENT: PEERLA, EOMI, + scleral icterus Neck: supple, no JVD or thyromegaly Chest: CTA bilaterally, no wheezes, crackles, or other adventitious sounds CV: RRR, no m/g/c/r Abd: Moderate ascities, + fluid wave, NT, +BS in all four quadrants; no HSM, guarding, ridigity, or rebound tenderness Ext: 1+ LE edema, well perfused with 2+ pulses Skin: no rash or lesions noted Lymph: no LAD  Data: Lab Results  Component Value Date   WBC 8.9 10/17/2014   HGB 15.7 10/17/2014   HCT 47.3 10/17/2014   MCV 100.4* 10/17/2014   PLT 92* 10/17/2014    Recent Labs Lab 10/17/14 1449 10/17/14 1924  HGB 16.2 15.7   Lab Results  Component Value Date   NA 137 10/17/2014   K 3.8 10/17/2014   CL 103 10/17/2014   CO2 25 10/17/2014   BUN 7 10/17/2014   CREATININE 0.70 10/17/2014   Lab Results  Component Value Date   ALT 32 10/17/2014   AST 79* 10/17/2014   ALKPHOS 117 10/17/2014   BILITOT 4.9* 10/17/2014    Recent Labs Lab 10/17/14 1450  APTT 39*  INR 1.27   Assessment/Plan: Ronnie Cohen is a 49 y.o. male h/o HTN and anxiety admitted for new cirrhosis, jaundice, and ascites.  Cirrhosis evident on RUQ [place] and CT scan, likely alcohol related but we will perform a full serologic work-up to r/o AI hepatitis and other etiologies. Hepatitis panel per internal medicine pending.        Recommendations: 1. Cirrhosis with ascites - Diagnostic/therapeutic paracentesis scheduled, including cytology and albumin to calculate SAAG - Serologic work-up ordered, labs drawn tomorrow morning - Continue Lasix 40mg  and Spironolactone 50mg   - Continue low salt diet  Thank you for the consult. We will follow along with you. Please call with questions or concerns.  Lavera Guise, PA-C [facility] Phone: [phone] Pager: [phone]

## 2014-10-19 LAB — IRON AND TIBC
IRON: 88 ug/dL (ref 45–182)
SATURATION RATIOS: 35 % (ref 17.9–39.5)
TIBC: 254 ug/dL (ref 250–450)
UIBC: 166 ug/dL

## 2014-10-19 LAB — HEPATITIS B SURFACE ANTIGEN: Hepatitis B Surface Ag: NEGATIVE

## 2014-10-19 LAB — COMPREHENSIVE METABOLIC PANEL
ALT: 30 U/L (ref 17–63)
AST: 72 U/L — AB (ref 15–41)
Albumin: 3.1 g/dL — ABNORMAL LOW (ref 3.5–5.0)
Alkaline Phosphatase: 104 U/L (ref 38–126)
Anion gap: 9 (ref 5–15)
BILIRUBIN TOTAL: 4 mg/dL — AB (ref 0.3–1.2)
BUN: 8 mg/dL (ref 6–20)
CO2: 25 mmol/L (ref 22–32)
CREATININE: 0.6 mg/dL — AB (ref 0.61–1.24)
Calcium: 8.4 mg/dL — ABNORMAL LOW (ref 8.9–10.3)
Chloride: 104 mmol/L (ref 101–111)
Glucose, Bld: 92 mg/dL (ref 65–99)
POTASSIUM: 3.3 mmol/L — AB (ref 3.5–5.1)
Sodium: 138 mmol/L (ref 135–145)
TOTAL PROTEIN: 5.7 g/dL — AB (ref 6.5–8.1)

## 2014-10-19 LAB — HEPATITIS PANEL, ACUTE
HCV Ab: <0.1 s/co ratio (ref 0.0–0.9)
HEP A IGM: NEGATIVE
HEP B C IGM: NEGATIVE
Hepatitis B Surface Ag: NEGATIVE

## 2014-10-19 LAB — FERRITIN: FERRITIN: 232 ng/mL (ref 24–336)

## 2014-10-19 LAB — PROTIME-INR
INR: 1.26
Prothrombin Time: 16 seconds — ABNORMAL HIGH (ref 11.4–15.0)

## 2014-10-19 MED ORDER — POTASSIUM CHLORIDE ER 10 MEQ PO TBCR: 10.0000 meq | EXTENDED_RELEASE_TABLET | Freq: Every day | ORAL | Status: AC

## 2014-10-19 MED ORDER — SPIRONOLACTONE 50 MG PO TABS: 50.0000 mg | ORAL_TABLET | Freq: Every day | ORAL | Status: AC

## 2014-10-19 MED ORDER — FUROSEMIDE 40 MG PO TABS: 40.0000 mg | ORAL_TABLET | Freq: Every day | ORAL | Status: AC

## 2014-10-19 MED ORDER — OXYCODONE HCL 5 MG PO TABS: 5.0000 mg | ORAL_TABLET | Freq: Three times a day (TID) | ORAL | Status: DC | PRN

## 2014-10-19 NOTE — Plan of Care (Signed)
Problem: Discharge Progression Outcomes Goal: Other Discharge Outcomes/Goals Outcome: Progressing Plan of Care Progress to Goal:   Pt report pain once. Pt has been resting comfortably during shift. No other signs of distress noted. Will continue to monitor.

## 2014-10-19 NOTE — Discharge Instructions (Signed)
°  DIET:  Low Na diet  DISCHARGE CONDITION:  Stable  ACTIVITY:  Activity as tolerated  OXYGEN:  Home Oxygen: No.   Oxygen Delivery: room air  DISCHARGE LOCATION:  home    ADDITIONAL DISCHARGE INSTRUCTION:   If you experience worsening of your admission symptoms, develop shortness of breath, life threatening emergency, suicidal or homicidal thoughts you must seek medical attention immediately by calling 911 or calling your MD immediately  if symptoms less severe.  You Must read complete instructions/literature along with all the possible adverse reactions/side effects for all the Medicines you take and that have been prescribed to you. Take any new Medicines after you have completely understood and accpet all the possible adverse reactions/side effects.   Please note  You were cared for by a hospitalist during your hospital stay. If you have any questions about your discharge medications or the care you received while you were in the hospital after you are discharged, you can call the unit and asked to speak with the hospitalist on call if the hospitalist that took care of you is not available. Once you are discharged, your primary care physician will handle any further medical issues. Please note that NO REFILLS for any discharge medications will be authorized once you are discharged, as it is imperative that you return to your primary care physician (or establish a relationship with a primary care physician if you do not have one) for your aftercare needs so that they can reassess your need for medications and monitor your lab values.

## 2014-10-19 NOTE — Discharge Planning (Signed)
Pt discharged home with wife. Prescriptions given to get filled. Dietian spoke with pt regarding low sodium and fat diet. Wife will make fu appt with Dr. Gustavo Lah.

## 2014-10-19 NOTE — Discharge Summary (Signed)
Ronnie Cohen, 49 y.o., DOB [birth year], MRN [id number]. Admission date: 10/17/2014 Discharge Date 10/19/2014 Primary MD Pcp Not In System Admitting Physician Epifanio Lesches, MD  Admission Diagnosis  Hyperbilirubinemia [E80.6] Ascites [R18.8]  Discharge Diagnosis   Active Problems:   Ascites   Cirrhosis  hypertension Anxiety disorder Vertigo Alcohol abuse      Hospital Course Ronnie Cohen is a 49 y.o. male with a known history of hypertension, anxiety brought in by the family secondary to abdominal bloating, worsening the leg swelling, weight gain of 20 pounds in 2-3 weeks. According to the patient he has been having the abdominal swelling since January but got worse recently. Patient had outpatient ultrasound which did confirm liver cirrhosis. He also had ascites noted. Due to this he was referred to the hospital for admission for new onset ascites. Patient also had a CT scan of the abdomen. He underwent a workup for his liver cirrhosis workup is currently pending. He does drink 2-4 glasses of wine on daily basis. Likely the cause for his liver cirrhosis. He underwent paracentesis the fluid is consistent with transudative. He will need a close outpatient follow-up for follow-up on the workup for his liver disease. His hepatitis A, B, and C panel were negative.          Consults  GI  Significant Tests:  See full reports for all details    Ct Abdomen Pelvis W Contrast  10/17/2014   CLINICAL DATA:  Generalized abdominal distention and swelling since Saturday.  EXAM: CT ABDOMEN AND PELVIS WITH CONTRAST  TECHNIQUE: Multidetector CT imaging of the abdomen and pelvis was performed using the standard protocol following bolus administration of intravenous contrast.  CONTRAST:  145mL OMNIPAQUE IOHEXOL 300 MG/ML  SOLN  COMPARISON:  None.  FINDINGS: The there is moderate to marked ascites in the abdomen and pelvis.  No focal liver lesion is identified. The spleen, pancreas, adrenal  glands and kidneys are normal. There is a gallstone in the gallbladder. The aorta is normal without aneurysmal dilatation. There is no abdominal lymphadenopathy. There is no small bowel obstruction or diverticulitis. There is mild umbilical herniation of ascites and mesenteric fat.  Partial fluid-filled bladder is normal. Ascites is identified in the pelvis. There is mild atelectasis of the bilateral lung bases. Degenerative joint changes of the spine are noted.  IMPRESSION: Moderate-to-marked ascites in the abdomen and pelvis.  No other acute abnormalities identified in the abdomen and pelvis.   Electronically Signed   By: Abelardo Diesel M.D.   On: 10/17/2014 17:25   US Paracentesis  10/18/2014   CLINICAL DATA:  Ascites.  EXAM: ULTRASOUND GUIDED PARACENTESIS  COMPARISON:  CT scan of October 17, 2014.  PROCEDURE: An ultrasound guided paracentesis was thoroughly discussed with the patient and questions answered. The benefits, risks, alternatives and complications were also discussed. The patient understands and wishes to proceed with the procedure. Written consent was obtained.  Ultrasound was performed to localize and mark an adequate pocket of fluid in the right lower quadrant of the abdomen. The area was then prepped and draped in the normal sterile fashion. 1% Lidocaine was used for local anesthesia. Under ultrasound guidance a Safe-T-Centesis catheter was introduced. Paracentesis was performed. The catheter was removed and a dressing applied.  COMPLICATIONS: None immediate.  FINDINGS: A total of approximately 6 L of serous fluid was removed. A fluid sample was sent for laboratory analysis.  IMPRESSION: Successful ultrasound guided paracentesis yielding 6 L of ascites.   Electronically Signed  By: Marijo Conception, M.D.   On: 10/18/2014 15:07       Today   Subjective:   Ronnie Cohen feels better abdominal distention much better  Objective:   Blood pressure 114/60, pulse 67, temperature 98.1 F  (36.7 C), temperature source Oral, resp. rate 18, height 6\' 1"  (1.854 m), weight 117.255 kg (258 lb 8 oz), SpO2 93 %.  .  Intake/Output Summary (Last 24 hours) at 10/19/14 1242 Last data filed at 10/19/14 0900  Gross per 24 hour  Intake    360 ml  Output      0 ml  Net    360 ml    Exam VITAL SIGNS: Blood pressure 114/60, pulse 67, temperature 98.1 F (36.7 C), temperature source Oral, resp. rate 18, height 6\' 1"  (1.854 m), weight 117.255 kg (258 lb 8 oz), SpO2 93 %.  GENERAL:  49 y.o.-year-old patient lying in the bed with no acute distress.  EYES: Pupils equal, round, reactive to light and accommodation. No scleral icterus. Extraocular muscles intact.  HEENT: Head atraumatic, normocephalic. Oropharynx and nasopharynx clear.  NECK:  Supple, no jugular venous distention. No thyroid enlargement, no tenderness.  LUNGS: Normal breath sounds bilaterally, no wheezing, rales,rhonchi or crepitation. No use of accessory muscles of respiration.  CARDIOVASCULAR: S1, S2 normal. No murmurs, rubs, or gallops.  ABDOMEN: Soft, nontender, nondistended. Bowel sounds present. No organomegaly or mass.  EXTREMITIES: No pedal edema, cyanosis, or clubbing.  NEUROLOGIC: Cranial nerves II through XII are intact. Muscle strength 5/5 in all extremities. Sensation intact. Gait not checked.  PSYCHIATRIC: The patient is alert and oriented x 3.  SKIN: No obvious rash, lesion, or ulcer.   Data Review     CBC w Diff: Lab Results  Component Value Date   WBC 8.9 10/17/2014   HGB 15.7 10/17/2014   HCT 47.3 10/17/2014   PLT 92* 10/17/2014   LYMPHOPCT 28% 10/17/2014   MONOPCT 14% 10/17/2014   EOSPCT 1% 10/17/2014   BASOPCT 1% 10/17/2014   CMP: Lab Results  Component Value Date   NA 138 10/19/2014   K 3.3* 10/19/2014   CL 104 10/19/2014   CO2 25 10/19/2014   BUN 8 10/19/2014   CREATININE 0.60* 10/19/2014   PROT 5.7* 10/19/2014   ALBUMIN 3.1* 10/19/2014   BILITOT 4.0* 10/19/2014   ALKPHOS 104  10/19/2014   AST 72* 10/19/2014   ALT 30 10/19/2014  .  Micro Results Recent Results (from the past 240 hour(s))  Body fluid culture     Status: None (Preliminary result)   Collection Time: 10/18/14  3:14 PM  Result Value Ref Range Status   Specimen Description ABDOMEN  Final   Special Requests NONE  Final   Gram Stain PENDING  Incomplete   Culture NO GROWTH < 24 HOURS  Final   Report Status PENDING  Incomplete        Code Status Orders        Start     Ordered   10/17/14 1857  Full code   Continuous     10/17/14 1901          Follow-up Information    Follow up with SKULSKIE, Billie Ruddy, MD In 7 days.   Specialty:  Gastroenterology   Contact information:   [place] [address] [phone]       Discharge Medications     Medication List    STOP taking these medications  losartan 25 MG tablet  Commonly known as:  COZAAR      TAKE these medications        ALPRAZolam 0.25 MG tablet  Commonly known as:  XANAX  Take 1 tablet by mouth daily as needed.     furosemide 40 MG tablet  Commonly known as:  LASIX  Take 1 tablet (40 mg total) by mouth daily.     multivitamin with minerals Tabs tablet  Take 1 tablet by mouth daily.     oxyCODONE 5 MG immediate release tablet  Commonly known as:  Oxy IR/ROXICODONE  Take 1 tablet (5 mg total) by mouth every 8 (eight) hours as needed for moderate pain.     potassium chloride 10 MEQ tablet  Commonly known as:  K-DUR  Take 1 tablet (10 mEq total) by mouth daily.     spironolactone 50 MG tablet  Commonly known as:  ALDACTONE  Take 1 tablet (50 mg total) by mouth daily.           Total Time in preparing paper work, data evaluation and todays exam - 35 minutes  Dustin Flock M.D on 10/19/2014 at 12:42 PM  [facility]   Office  [phone]

## 2014-10-19 NOTE — Progress Notes (Signed)
Nutrition Education Note  RD consulted for nutrition education regarding Low Sodium diet. RD provided "Low Sodium Nutrition Therapy" handout from the Academy of [employer] Dietetics. Reviewed patient's dietary recall. Provided examples on ways to decrease sodium intake in diet. Discouraged intake of processed foods and use of salt shaker. Encouraged fresh fruits and vegetables as well as whole grain sources of carbohydrates to maximize fiber intake.   RD discussed why it is important for patient to adhere to diet recommendations, and emphasized the role of fluids, foods to avoid, and importance of weighing self. Teach back method used.  Expect good compliance.   Body mass index is 34.11 kg/(m^2).   Current diet order is 2 gm Sodium, patient is consuming approximately 100% of meals at this time. Labs and medications reviewed. No further nutrition interventions warranted at this time. RD contact information provided. If additional nutrition issues arise, please re-consult RD.   Ronnie Cohen, Ronnie Cohen, Ronnie Cohen (pager)

## 2014-10-20 LAB — ANA COMPREHENSIVE PANEL
DS DNA AB: 2 [IU]/mL (ref 0–9)
ENA SM Ab Ser-aCnc: <0.2 AI (ref 0.0–0.9)
Ribonucleic Protein: <0.2 AI (ref 0.0–0.9)
SSA (Ro) (ENA) Antibody, IgG: <0.2 AI (ref 0.0–0.9)
Scleroderma (Scl-70) (ENA) Antibody, IgG: <0.2 AI (ref 0.0–0.9)

## 2014-10-20 LAB — MITOCHONDRIAL ANTIBODIES: MITOCHONDRIAL M2 AB, IGG: 14 U (ref 0.0–20.0)

## 2014-10-21 LAB — AFP TUMOR MARKER: AFP TUMOR MARKER: 4.1 ng/mL (ref 0.0–8.3)

## 2014-10-21 LAB — CERULOPLASMIN: Ceruloplasmin: 27.3 mg/dL (ref 16.0–31.0)

## 2014-10-21 LAB — ANTI-SMOOTH MUSCLE ANTIBODY, IGG: F-ACTIN AB IGG: 20 U — AB (ref 0–19)

## 2014-10-21 LAB — ANA W/REFLEX: ANA: NEGATIVE

## 2014-10-21 LAB — MITOCHONDRIAL ANTIBODIES: Mitochondrial M2 Ab, IgG: 12 Units (ref 0.0–20.0)

## 2014-10-22 LAB — BODY FLUID CULTURE: CULTURE: NO GROWTH

## 2014-10-22 LAB — PATHOLOGIST SMEAR REVIEW: PATH REVIEW: NEGATIVE

## 2014-10-24 LAB — CYTOLOGY - NON PAP

## 2014-10-31 ENCOUNTER — Other Ambulatory Visit: Payer: Self-pay | Admitting: Gastroenterology

## 2014-10-31 DIAGNOSIS — R634 Abnormal weight loss: Secondary | ICD-10-CM

## 2014-10-31 DIAGNOSIS — R7989 Other specified abnormal findings of blood chemistry: Secondary | ICD-10-CM

## 2014-10-31 DIAGNOSIS — R945 Abnormal results of liver function studies: Secondary | ICD-10-CM

## 2014-10-31 DIAGNOSIS — R188 Other ascites: Secondary | ICD-10-CM

## 2014-11-01 ENCOUNTER — Other Ambulatory Visit: Payer: Self-pay | Admitting: Gastroenterology

## 2014-11-01 DIAGNOSIS — R188 Other ascites: Secondary | ICD-10-CM

## 2014-11-01 DIAGNOSIS — R945 Abnormal results of liver function studies: Secondary | ICD-10-CM

## 2014-11-01 DIAGNOSIS — R7989 Other specified abnormal findings of blood chemistry: Secondary | ICD-10-CM

## 2014-11-01 DIAGNOSIS — R634 Abnormal weight loss: Secondary | ICD-10-CM

## 2014-11-07 ENCOUNTER — Ambulatory Visit: Payer: BLUE CROSS/BLUE SHIELD

## 2014-11-07 ENCOUNTER — Ambulatory Visit
Admission: RE | Admit: 2014-11-07 | Discharge: 2014-11-07 | Disposition: A | Discharge status: Home / Self Care | Payer: BLUE CROSS/BLUE SHIELD | Source: Ambulatory Visit | Attending: Gastroenterology | Admitting: Gastroenterology

## 2014-11-07 DIAGNOSIS — K8020 Calculus of gallbladder without cholecystitis without obstruction: Secondary | ICD-10-CM | POA: Insufficient documentation

## 2014-11-07 DIAGNOSIS — R188 Other ascites: Secondary | ICD-10-CM | POA: Insufficient documentation

## 2014-11-07 DIAGNOSIS — R7989 Other specified abnormal findings of blood chemistry: Secondary | ICD-10-CM | POA: Insufficient documentation

## 2014-11-07 DIAGNOSIS — R634 Abnormal weight loss: Secondary | ICD-10-CM

## 2014-11-07 DIAGNOSIS — R945 Abnormal results of liver function studies: Secondary | ICD-10-CM

## 2014-11-22 ENCOUNTER — Encounter: Payer: BLUE CROSS/BLUE SHIELD | Attending: Physician Assistant | Admitting: Dietician

## 2014-11-22 ENCOUNTER — Encounter: Payer: Self-pay | Admitting: Dietician

## 2014-11-22 VITALS — Ht 73.0 in | Wt 217.0 lb

## 2014-11-22 DIAGNOSIS — K769 Liver disease, unspecified: Secondary | ICD-10-CM | POA: Diagnosis not present

## 2014-11-22 DIAGNOSIS — K7460 Unspecified cirrhosis of liver: Secondary | ICD-10-CM | POA: Insufficient documentation

## 2014-11-22 DIAGNOSIS — R188 Other ascites: Secondary | ICD-10-CM | POA: Insufficient documentation

## 2014-11-22 NOTE — Progress Notes (Signed)
Medical Nutrition Therapy: Visit start time: 1100  end time: 1230  Assessment:  Diagnosis: cirrhosis/ liver disease with ascites Past medical history: hypertension Psychosocial issues/ stress concerns: history of anxiety, taking anti-anxiety medication. He reports moderate-high stress level. Preferred learning method:  . Auditory . Hands-on   Current weight: 217lbs  Height: 6'1" Medications, supplements: reviewed list in chart with patient Progress and evaluation: Patient and his wife report initial testing indicated cirrhosis, but subsequent ultrasound done recently shows no cirrhosis.          They are waiting on some final test results for a definitive diagnosis.          He has stopped all alcohol intake as well as fitness/ health supplements he was taking.         He is following a 1500mg  sodium diet strictly.          He does report decreased appetite, nausea if he overeats.  Physical activity: walking, yardwork 30-90 minutes, 4-5 days per week  Dietary Intake:  Usual eating pattern includes 3 meals and 3 snacks per day. Dining out frequency: 0 meals per week recently.  Breakfast: 7:30 cereal shredded wheat (low Na) skim milk, light cranberry juice, coffee Snack: peanut butter (justin's, 3mg  Na) with wheat thins unsalted, hummus Lunch: [place] and cheese (swiss, lite) rollup + 1/2 peanut butter and jelly sandwich (low Na bread), or leftovers with unsalted foods Snack: similar to am snack, or frozen fruit bar, or orange Supper: grilled chicken 4oz, steamed veggies/ roasted, brummel and brown spread, no red meats recently Snack: wheat thins, a few chocolate covered almonds Beverages: water, sparkling ice  Nutrition Care Education: Topics covered: low sodium diet, adequate kcal intake and protein intake for liver disease Basic nutrition: basic food groups, appropriate nutrient balance, appropriate meal and snack schedule       2300kcal meal plan to slow weight loss, 1.2g protein  per kg body weight (118g daily) Hypertension: 1500mg  sodium diet, identifying high sodium foods, identifying food sources of potassium, magnesium Other lifestyle changes: potential effects of weight control and body building supplements on liver health  Nutritional Diagnosis:  NI-5.1 Increased nutrient needs (specify): protein, energy As related to liver disease.  As evidenced by MD report, patient report.  Intervention: Instruction as noted above.   Patient seems to be keeping sodium intake within goal.   Advised 2-6oz protein food (as tolerated) with each meal for a total of 10oz daily, as well as 3-4 carb servings with each meal.   Discussed possibility of adding a fourth snack daily, of a nutrition or protein drink/ smoothie.    Encouraged patient to call as needed with any questions or concerns.   Education Materials given:  . Food lists/ Planning A Balanced Meal: 2300kcal meal plan . Sample meal pattern/ menus: Quick and Healthy Meal Ideas . Goals/ instructions . Other Low-Sodium Nutrition Therapy            Mrs. Dash recipes  Learner/ who was taught:  . Patient  . Spouse/ partner  Level of understanding: Marland Kitchen Verbalizes/ demonstrates competency  Demonstrated degree of understanding via:   Teach back Learning barriers: . None  Willingness to learn/ readiness for change: . Eager, change in progress  Monitoring and Evaluation:  Dietary intake, exercise, liver health, and body weight      follow up: prn

## 2014-11-22 NOTE — Patient Instructions (Signed)
   Add another snack if needed to keep your calorie intake up -- include a protein such as greek yogurt or milk or protein powder  Eat protein and starch before filling up on large portions of vegetables.   Can add 1-2 extra teaspoons of soft margarine, oil, mayo, or salad dressing to your foods to boost calories.

## 2014-12-20 ENCOUNTER — Encounter: Admission: RE | Payer: Self-pay | Source: Ambulatory Visit

## 2014-12-20 ENCOUNTER — Ambulatory Visit
Admission: RE | Admit: 2014-12-20 | Payer: BLUE CROSS/BLUE SHIELD | Source: Ambulatory Visit | Admitting: Gastroenterology

## 2014-12-20 SURGERY — ESOPHAGOGASTRODUODENOSCOPY (EGD) WITH PROPOFOL
Anesthesia: General

## 2014-12-27 LAB — PH, BODY FLUID: PH, BODY FLUID: 8.2

## 2015-02-07 ENCOUNTER — Encounter: Payer: Self-pay | Admitting: *Deleted

## 2015-02-10 ENCOUNTER — Encounter: Admission: RE | Payer: Self-pay | Source: Ambulatory Visit

## 2015-02-10 ENCOUNTER — Ambulatory Visit
Admission: RE | Admit: 2015-02-10 | Payer: BLUE CROSS/BLUE SHIELD | Source: Ambulatory Visit | Admitting: Gastroenterology

## 2015-02-10 SURGERY — ESOPHAGOGASTRODUODENOSCOPY (EGD) WITH PROPOFOL
Anesthesia: General

## 2015-02-26 ENCOUNTER — Other Ambulatory Visit: Payer: Self-pay | Admitting: Gastroenterology

## 2015-02-26 DIAGNOSIS — K7030 Alcoholic cirrhosis of liver without ascites: Secondary | ICD-10-CM

## 2015-04-30 ENCOUNTER — Ambulatory Visit
Admission: RE | Admit: 2015-04-30 | Discharge: 2015-04-30 | Disposition: A | Discharge status: Home / Self Care | Payer: BLUE CROSS/BLUE SHIELD | Source: Ambulatory Visit | Attending: Gastroenterology | Admitting: Gastroenterology

## 2015-04-30 ENCOUNTER — Other Ambulatory Visit: Payer: Self-pay | Admitting: Gastroenterology

## 2015-04-30 DIAGNOSIS — K8020 Calculus of gallbladder without cholecystitis without obstruction: Secondary | ICD-10-CM | POA: Insufficient documentation

## 2015-04-30 DIAGNOSIS — K7030 Alcoholic cirrhosis of liver without ascites: Secondary | ICD-10-CM

## 2015-04-30 DIAGNOSIS — R188 Other ascites: Secondary | ICD-10-CM | POA: Diagnosis not present

## 2015-08-14 ENCOUNTER — Other Ambulatory Visit: Payer: Self-pay | Admitting: Gastroenterology

## 2015-08-14 DIAGNOSIS — K7030 Alcoholic cirrhosis of liver without ascites: Secondary | ICD-10-CM

## 2015-08-14 DIAGNOSIS — R1310 Dysphagia, unspecified: Secondary | ICD-10-CM

## 2015-08-20 ENCOUNTER — Ambulatory Visit
Admission: RE | Admit: 2015-08-20 | Discharge: 2015-08-20 | Disposition: A | Discharge status: Home / Self Care | Payer: BLUE CROSS/BLUE SHIELD | Source: Ambulatory Visit | Attending: Gastroenterology | Admitting: Gastroenterology

## 2015-08-20 DIAGNOSIS — K7460 Unspecified cirrhosis of liver: Secondary | ICD-10-CM | POA: Insufficient documentation

## 2015-08-20 DIAGNOSIS — R1310 Dysphagia, unspecified: Secondary | ICD-10-CM | POA: Diagnosis present

## 2015-08-20 DIAGNOSIS — K219 Gastro-esophageal reflux disease without esophagitis: Secondary | ICD-10-CM | POA: Insufficient documentation

## 2015-08-20 DIAGNOSIS — K7030 Alcoholic cirrhosis of liver without ascites: Secondary | ICD-10-CM

## 2015-10-30 ENCOUNTER — Other Ambulatory Visit: Payer: Self-pay | Admitting: Gastroenterology

## 2015-10-30 DIAGNOSIS — K7030 Alcoholic cirrhosis of liver without ascites: Secondary | ICD-10-CM

## 2015-11-05 ENCOUNTER — Ambulatory Visit
Admission: RE | Admit: 2015-11-05 | Discharge: 2015-11-05 | Disposition: A | Discharge status: Home / Self Care | Payer: BLUE CROSS/BLUE SHIELD | Source: Ambulatory Visit | Attending: Gastroenterology | Admitting: Gastroenterology

## 2015-11-05 DIAGNOSIS — K7030 Alcoholic cirrhosis of liver without ascites: Secondary | ICD-10-CM | POA: Diagnosis present

## 2015-11-05 DIAGNOSIS — K8080 Other cholelithiasis without obstruction: Secondary | ICD-10-CM | POA: Insufficient documentation

## 2015-11-05 IMAGING — US US ABDOMEN COMPLETE
1 series · 13 of 25 positions shown · non-contrast
Comparison: Abdominal ultrasound dated [DATE]

CLINICAL DATA: Alcoholic cirrhosis, gallstones

EXAM:
ABDOMEN ULTRASOUND COMPLETE

[Series 1: us abdomen complete · 0.20mm/px · 13 of 123 slices shown]
[im 1/123]
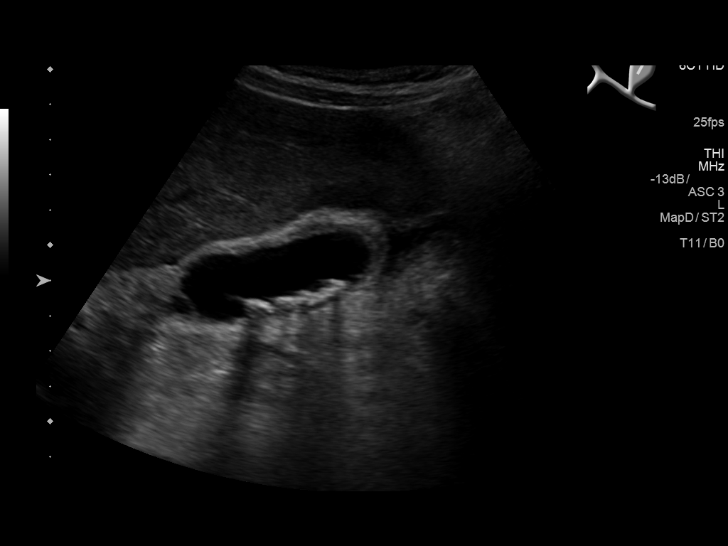
[im 11/123]
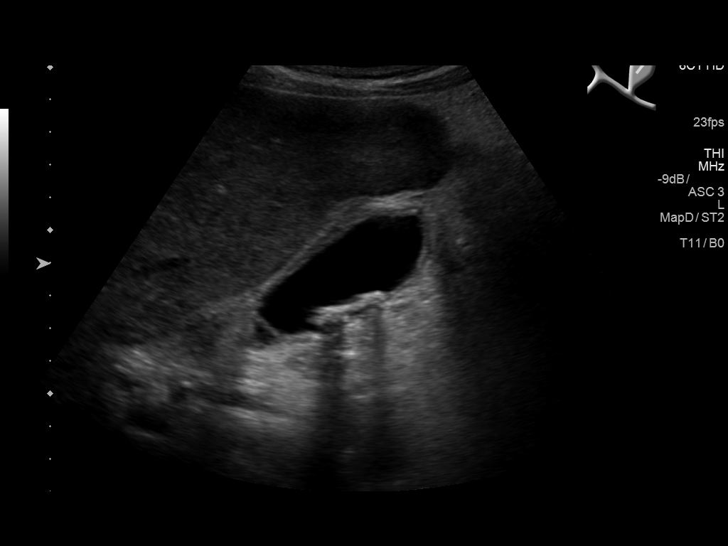
[im 21/123]
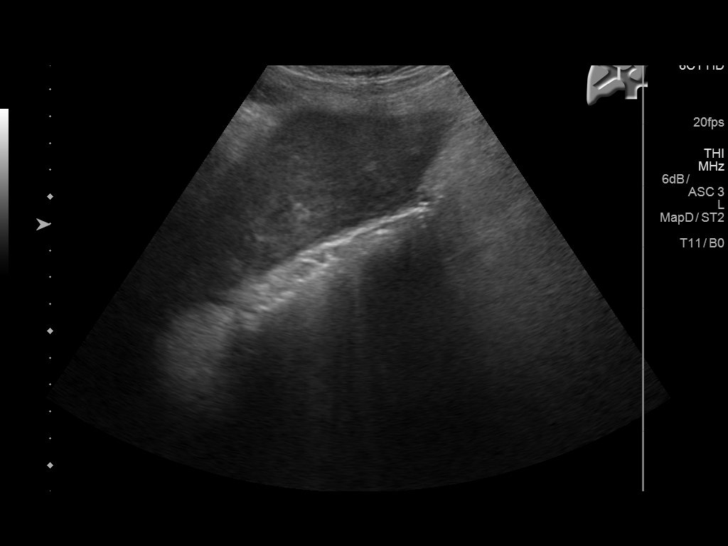
[im 31/123]
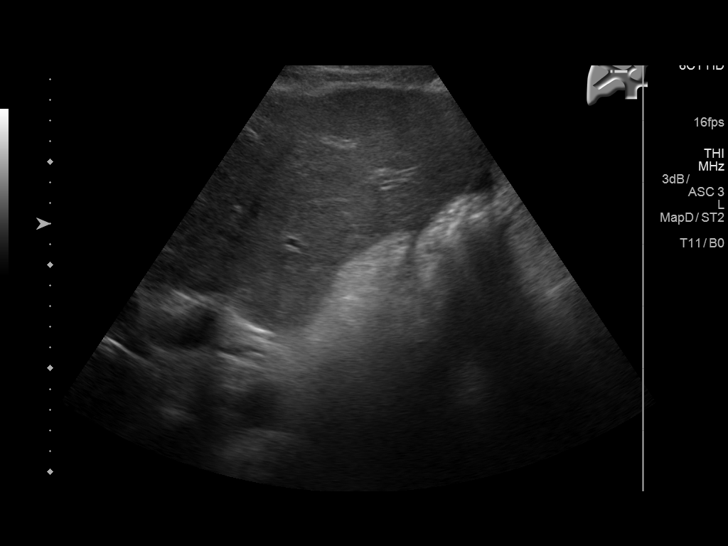
[im 41/123]
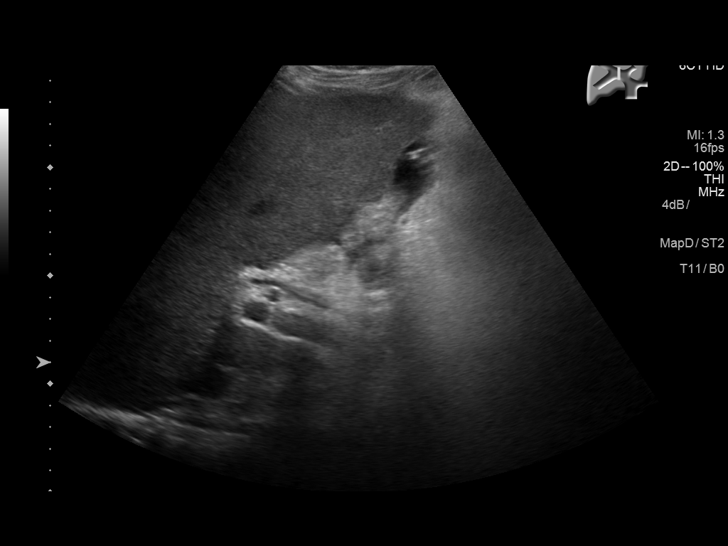
[im 51/123]
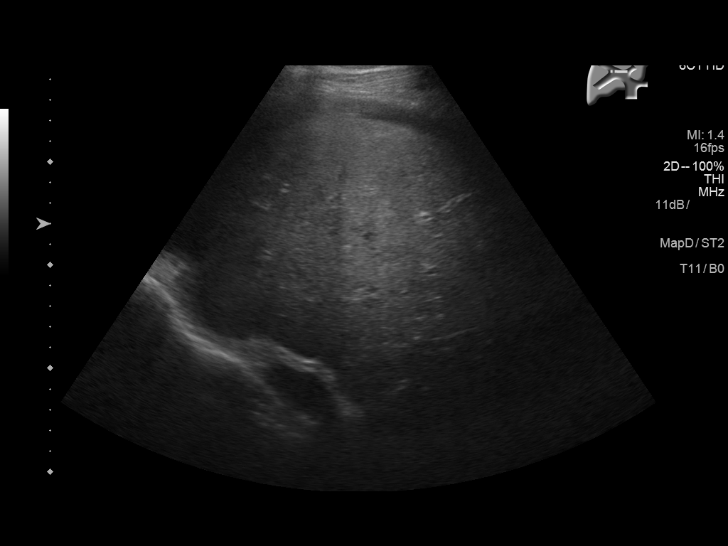
[im 62/123]
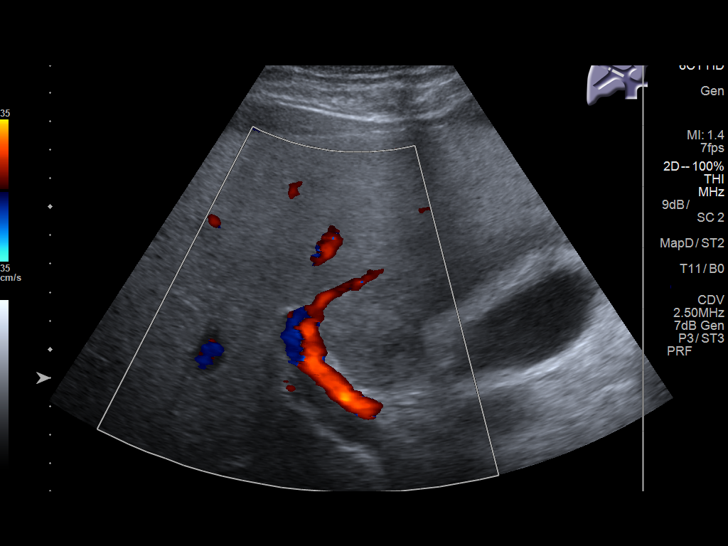
[im 72/123]
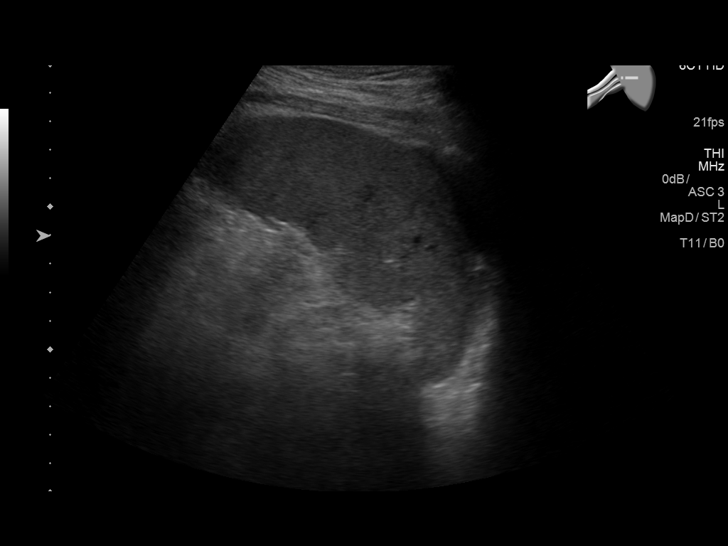
[im 82/123]
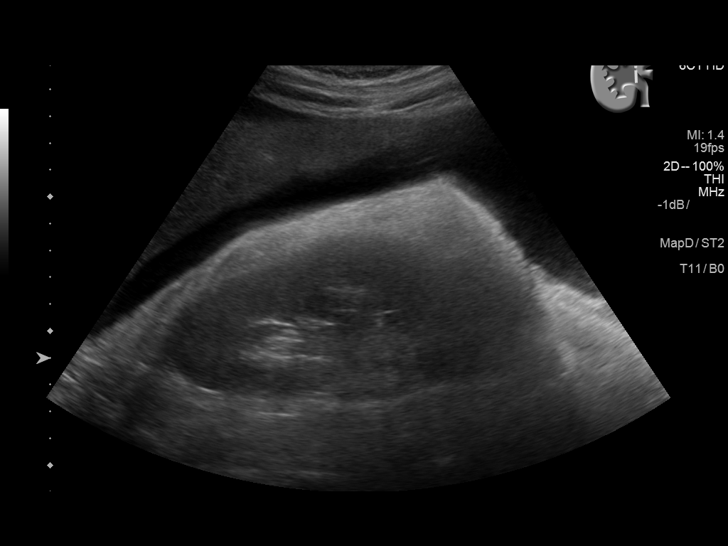
[im 92/123]
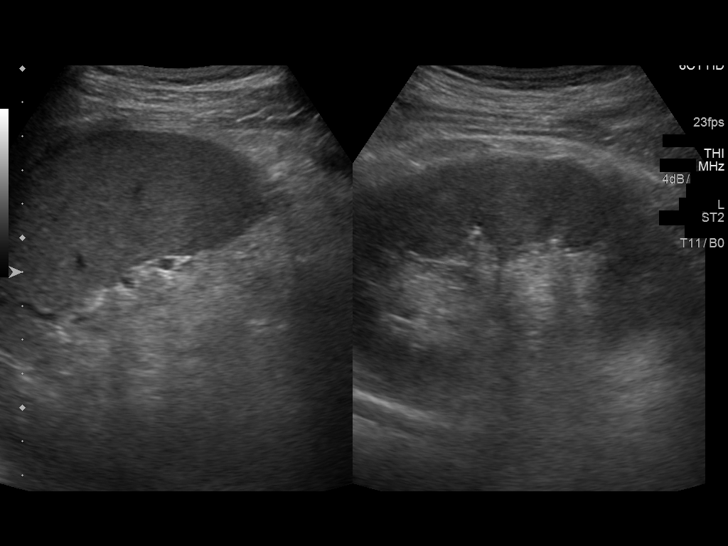
[im 102/123]
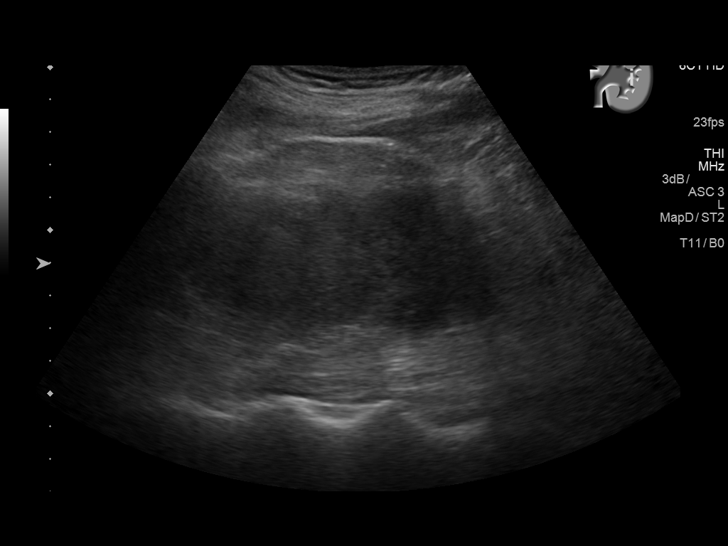
[im 112/123]
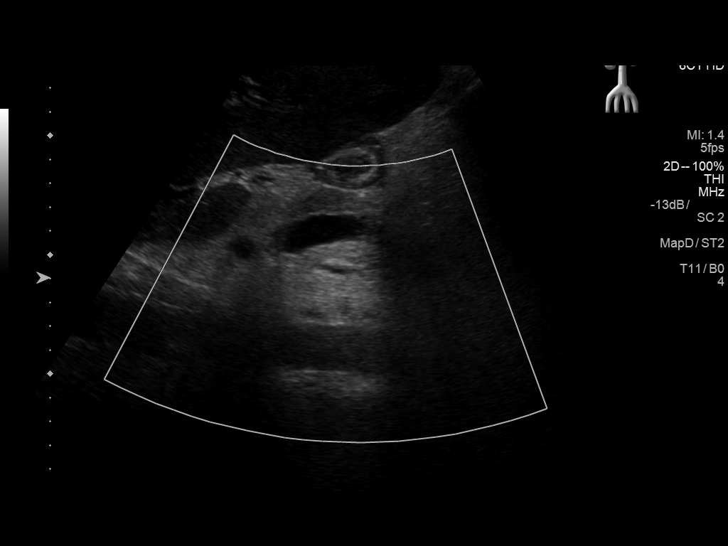
[im 123/123]
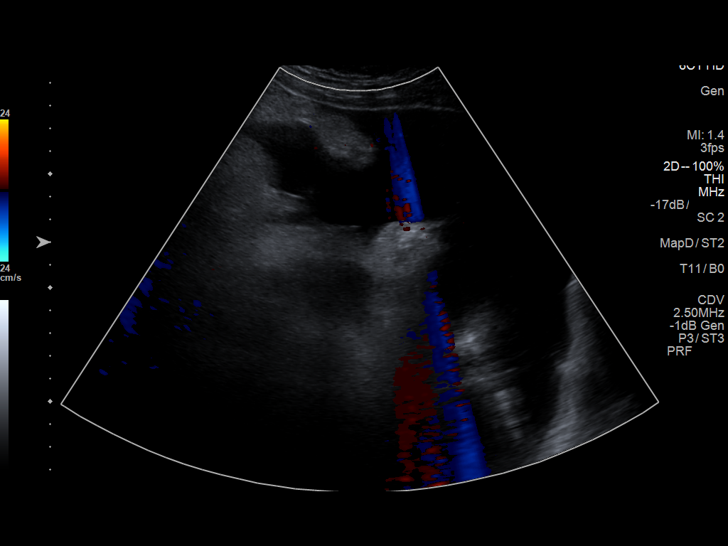

[13 of 25 positions shown; findings below may reference images not displayed]

FINDINGS: Gallbladder: The gallbladder is adequately distended. There are
multiple echogenic mobile shadowing stones. There is mild
gallbladder wall thickening to 6 mm. There is a trace of
pericholecystic fluid. There is no positive sonographic Murphy's
sign.

Common bile duct: Diameter: 5 mm

Liver: The hepatic echotexture is mildly increased. The liver
appears mildly shrunken. The surface contour is mildly nodular.
There is no discrete mass or ductal dilation.

IVC: No abnormality visualized.

Pancreas: Bowel gas limits evaluation of the pancreatic head and
tail. The pancreatic body is grossly normal.

Spleen: 9.8 cm in length which is normal.

Right Kidney: Length: 14.0 cm. Echogenicity within normal limits. No
mass or hydronephrosis visualized.

Left Kidney: Length: 13.6 cm. Echogenicity within normal limits. No
mass or hydronephrosis visualized.

Abdominal aorta: Bowel gas limits evaluation of the distal abdominal
aorta. More proximally the aorta appears normal.

Other findings: There is a moderate amount of ascites throughout the
abdomen.
IMPRESSION: 1. Cirrhotic changes within the liver. No hepatic masses. Ascites
throughout the abdomen.
2. Gallstones without sonographic evidence of acute cholecystitis.
3. Limited visualization of the pancreas and abdominal aorta due to
bowel gas.

## 2015-11-12 DIAGNOSIS — K7031 Alcoholic cirrhosis of liver with ascites: Secondary | ICD-10-CM | POA: Insufficient documentation

## 2017-07-08 ENCOUNTER — Ambulatory Visit: Payer: Self-pay | Admitting: Podiatry

## 2017-07-15 ENCOUNTER — Ambulatory Visit: Payer: BLUE CROSS/BLUE SHIELD | Admitting: Podiatry

## 2017-07-15 ENCOUNTER — Encounter: Payer: Self-pay | Admitting: Podiatry

## 2017-07-15 VITALS — BP 115/74 | HR 58

## 2017-07-15 DIAGNOSIS — Z79899 Other long term (current) drug therapy: Secondary | ICD-10-CM | POA: Diagnosis not present

## 2017-07-15 DIAGNOSIS — B351 Tinea unguium: Secondary | ICD-10-CM | POA: Diagnosis not present

## 2017-07-16 LAB — HEPATIC FUNCTION PANEL
ALK PHOS: 158 IU/L — AB (ref 39–117)
ALT: 23 IU/L (ref 0–44)
AST: 54 IU/L — ABNORMAL HIGH (ref 0–40)
Albumin: 3.8 g/dL (ref 3.5–5.5)
BILIRUBIN, DIRECT: 0.73 mg/dL — AB (ref 0.00–0.40)
Bilirubin Total: 1.3 mg/dL — ABNORMAL HIGH (ref 0.0–1.2)
TOTAL PROTEIN: 6.8 g/dL (ref 6.0–8.5)

## 2017-07-18 NOTE — Progress Notes (Signed)
   Subjective: 52 year old male presenting today as a new patient with a chief complaint of discoloration to the bilateral nails that began about two years ago. He states he 'has tried everything' and the fungus will not resolved. There are no modifying factors noted. Patient is here for further evaluation and treatment.   Past Medical History:  Diagnosis Date  . Anxiety   . Cirrhosis ([place]) 10/18/2014  . Hypertension   . Vertigo     Objective: Physical Exam General: The patient is alert and oriented x3 in no acute distress.  Dermatology: Hyperkeratotic, discolored, thickened, onychodystrophy of nails noted bilaterally.  Skin is warm, dry and supple bilateral lower extremities. Negative for open lesions or macerations.  Vascular: Palpable pedal pulses bilaterally. No edema or erythema noted. Capillary refill within normal limits.  Neurological: Epicritic and protective threshold grossly intact bilaterally.   Musculoskeletal Exam: Range of motion within normal limits to all pedal and ankle joints bilateral. Muscle strength 5/5 in all groups bilateral.   Assessment: #1 onychomycosis nails 1-5 bilaterally   Plan of Care:  #1 Patient was evaluated. #2 Orders for liver function tests were ordered today. If normal, we will prescribe Lamisil.  #3 Appointment with Janett Billow for laser treatment.  #4 Return to clinic as needed.    Edrick Kins, DPM [facility]  Dr. Edrick Kins, [address]                                        [place], [place] [address]                Office [phone]  Fax [phone]

## 2017-07-21 ENCOUNTER — Ambulatory Visit: Payer: Self-pay

## 2017-07-21 ENCOUNTER — Telehealth: Payer: Self-pay | Admitting: *Deleted

## 2017-07-21 DIAGNOSIS — B351 Tinea unguium: Secondary | ICD-10-CM

## 2017-07-21 DIAGNOSIS — Z79899 Other long term (current) drug therapy: Secondary | ICD-10-CM

## 2017-07-21 MED ORDER — NONFORMULARY OR COMPOUNDED ITEM: 2 refills | Status: AC

## 2017-07-21 NOTE — Telephone Encounter (Signed)
-----   Message from Roney Jaffe, RN sent at 07/21/2017 11:23 AM EDT -----  Dr Amalia Hailey, This patient saw me for laser today, his blood work was abnormal, AST was high at 54. I told him to not take the lamisil, I recommended Shertech and sent off an Rx for it. I gave him a copy of his blood work and advised him to follow up with his PCP is regards to the elevated AST, fyi, it was elevated 2 years ago as well.   [place], [place]

## 2017-07-26 NOTE — Progress Notes (Signed)
Pt presents with mycotic infection of nails 1-5 bilateral  All other systems are negative  Laser therapy administered to affected nails and tolerated well. All safety precautions were in place. Advised patient to not take lamisil due to elevated AST levels. We did discuss topical treatments, and a Rx for Shertech was faxed over. I gave him a copy of his lab results and advised him to follow up with his PCP in regards to the elevated AST. Re-appointed in 4 weeks for 2nd treatment

## 2017-08-04 ENCOUNTER — Telehealth: Payer: Self-pay

## 2017-08-04 NOTE — Telephone Encounter (Signed)
Patient has been notified of his lab results.  He reports he is currently using a topical gel and laser treatments for the fungus.  He will follow up with his pcp

## 2017-08-04 NOTE — Telephone Encounter (Signed)
-----   Message from Edrick Kins, DPM sent at 07/28/2017  4:35 PM EDT ----- Regarding: Liver function test results Please contact patient and let him know that his liver enzymes are elevated.  No prescription for Lamisil.  Follow-up with PCP.  Thanks, Dr. Amalia Hailey

## 2017-08-19 ENCOUNTER — Other Ambulatory Visit: Payer: BLUE CROSS/BLUE SHIELD

## 2017-08-23 ENCOUNTER — Telehealth: Payer: Self-pay | Admitting: *Deleted

## 2017-08-23 NOTE — Telephone Encounter (Signed)
Pt asked if we referred pts out for laser treatment, since our laser was down. Left message informing pt we didn't refer out and would call as soon as our returned.

## 2017-09-20 ENCOUNTER — Telehealth: Payer: Self-pay | Admitting: Podiatry

## 2017-09-20 NOTE — Telephone Encounter (Signed)
Patient should have 90 days of Lamisil, but last appt was 5/24, so he should have enough lamisil thru the end of August. He doesn't need to come back in. Thanks, Dr. Amalia Hailey

## 2017-09-20 NOTE — Telephone Encounter (Signed)
Patient would like a refill on Lamisil. Please call patient back at [phone]

## 2017-09-20 NOTE — Telephone Encounter (Signed)
Do you want to refill the lamisil again or do you want him to come back in for another appt first and blood work?  Please advise

## 2017-09-23 ENCOUNTER — Other Ambulatory Visit: Payer: BLUE CROSS/BLUE SHIELD

## 2017-09-23 NOTE — Telephone Encounter (Signed)
I called patient and left voice mail to call back and verify if he is needing Lamisil or the Shertec cream.

## 2017-11-04 ENCOUNTER — Other Ambulatory Visit: Payer: BLUE CROSS/BLUE SHIELD

## 2019-04-25 ENCOUNTER — Other Ambulatory Visit: Payer: Self-pay

## 2019-04-25 ENCOUNTER — Emergency Department: Payer: BC Managed Care – PPO

## 2019-04-25 ENCOUNTER — Emergency Department
Admission: EM | Admit: 2019-04-25 | Discharge: 2019-04-26 | Disposition: A | Discharge status: Other Acute Inpatient Hospital | Payer: BC Managed Care – PPO | Attending: Emergency Medicine | Admitting: Emergency Medicine

## 2019-04-25 ENCOUNTER — Encounter: Payer: Self-pay | Admitting: Emergency Medicine

## 2019-04-25 DIAGNOSIS — I10 Essential (primary) hypertension: Secondary | ICD-10-CM | POA: Insufficient documentation

## 2019-04-25 DIAGNOSIS — Z20822 Contact with and (suspected) exposure to covid-19: Secondary | ICD-10-CM | POA: Insufficient documentation

## 2019-04-25 DIAGNOSIS — K56609 Unspecified intestinal obstruction, unspecified as to partial versus complete obstruction: Secondary | ICD-10-CM

## 2019-04-25 DIAGNOSIS — R1033 Periumbilical pain: Secondary | ICD-10-CM | POA: Diagnosis present

## 2019-04-25 DIAGNOSIS — Z79899 Other long term (current) drug therapy: Secondary | ICD-10-CM | POA: Insufficient documentation

## 2019-04-25 DIAGNOSIS — K420 Umbilical hernia with obstruction, without gangrene: Secondary | ICD-10-CM | POA: Diagnosis not present

## 2019-04-25 LAB — COMPREHENSIVE METABOLIC PANEL
ALT: 20 U/L (ref 0–44)
AST: 30 U/L (ref 15–41)
Albumin: 3.9 g/dL (ref 3.5–5.0)
Alkaline Phosphatase: 83 U/L (ref 38–126)
Anion gap: 14 (ref 5–15)
BUN: 10 mg/dL (ref 6–20)
CO2: 21 mmol/L — ABNORMAL LOW (ref 22–32)
Calcium: 8.8 mg/dL — ABNORMAL LOW (ref 8.9–10.3)
Chloride: 102 mmol/L (ref 98–111)
Creatinine, Ser: 0.62 mg/dL (ref 0.61–1.24)
GFR calc Af Amer: >60 mL/min (ref >60)
GFR calc non Af Amer: >60 mL/min (ref >60)
Glucose, Bld: 133 mg/dL — ABNORMAL HIGH (ref 70–99)
Potassium: 3.7 mmol/L (ref 3.5–5.1)
Sodium: 137 mmol/L (ref 135–145)
Total Bilirubin: 1.4 mg/dL — ABNORMAL HIGH (ref 0.3–1.2)
Total Protein: 6.9 g/dL (ref 6.5–8.1)

## 2019-04-25 LAB — PROTIME-INR
INR: 1.2 (ref 0.8–1.2)
Prothrombin Time: 14.7 seconds (ref 11.4–15.2)

## 2019-04-25 LAB — LIPASE, BLOOD: Lipase: 25 U/L (ref 11–51)

## 2019-04-25 MED ORDER — ONDANSETRON HCL 4 MG/2ML IJ SOLN
4.0000 mg | Freq: Once | INTRAMUSCULAR | Status: AC
  Administered 2019-04-25: 4 mg via INTRAVENOUS
  Filled 2019-04-25: qty 2

## 2019-04-25 MED ORDER — IOHEXOL 300 MG/ML  SOLN
100.0000 mL | Freq: Once | INTRAMUSCULAR | Status: AC | PRN
  Administered 2019-04-25: 100 mL via INTRAVENOUS

## 2019-04-25 MED ORDER — HYDROMORPHONE HCL 1 MG/ML IJ SOLN
1.0000 mg | Freq: Once | INTRAMUSCULAR | Status: AC
  Administered 2019-04-25: 1 mg via INTRAVENOUS
  Filled 2019-04-25: qty 1

## 2019-04-25 NOTE — ED Triage Notes (Signed)
Pt ambulatory to triage with slow gait, mask in place; appears uncomfortable; Pt c/o mid abd pain since this am accomp by N/V; st has umbilical hernia and cirrhosis

## 2019-04-25 NOTE — ED Notes (Signed)
Pt transported to CT ?

## 2019-04-25 NOTE — ED Provider Notes (Signed)
[facility] Emergency Department Provider Note _________   First MD Initiated Contact with Patient 04/25/19 2259     (approximate)  I have reviewed the triage vital signs and the nursing notes.   HISTORY  Chief Complaint Abdominal Pain    HPI Ronnie Cohen is a 54 y.o. male with below list of chronic medical conditions including ascites and hepatocellular carcinoma and umbilical hernia presents to the emergency department secondary to a 1 day history of generalized abdominal discomfort worse at the umbilicus.  Patient patient states bulging from the umbilical hernia today that is hard and tender to touch.  Patient admits to nausea and vomiting.  Patient denies any fever no diarrhea.       Past Medical History:  Diagnosis Date  . Anxiety   . Cirrhosis ([place]) 10/18/2014  . Hypertension   . Vertigo     Patient Active Problem List   Diagnosis Date Noted  . Alcoholic cirrhosis of liver with ascites ([place]) 11/12/2015  . Cirrhosis ([place]) 10/18/2014  . Ascites 10/17/2014    History reviewed. No pertinent surgical history.  Prior to Admission medications   Medication Sig Start Date End Date Taking? Authorizing Provider  ALPRAZolam Duanne Moron) 0.25 MG tablet Take 1 tablet by mouth daily as needed. 10/10/14   [provider]  folic acid (FOLVITE) 1 MG tablet Take 1 mg by mouth daily.    [provider]  furosemide (LASIX) 40 MG tablet Take 1 tablet (40 mg total) by mouth daily. 10/19/14   Dustin Flock, MD  magnesium oxide (MAG-OX) 400 MG tablet Take by mouth.    [provider]  Multiple Vitamin (MULTIVITAMIN WITH MINERALS) TABS tablet Take 1 tablet by mouth daily.    [provider]  Multiple Vitamins-Minerals (SUPER THERA VITE M) TABS Take by mouth.    [provider]  nadolol (CORGARD) 20 MG tablet Take by mouth. 02/28/17 02/28/18  [provider]  NONFORMULARY OR COMPOUNDED [place]:   Onychomycosis nail lacquer - fluconazole 2%, terbinafine 1%, DMSO apply to affected areas daily 07/21/17   Edrick Kins, DPM  omeprazole (PRILOSEC) 20 MG capsule Take by mouth. 02/28/17 02/28/18  [provider]  oxyCODONE (OXY IR/ROXICODONE) 5 MG immediate release tablet Take 1 tablet (5 mg total) by mouth every 8 (eight) hours as needed for moderate pain. Patient not taking: Reported on 11/22/2014 10/19/14   Dustin Flock, MD  potassium chloride (K-DUR) 10 MEQ tablet Take 1 tablet (10 mEq total) by mouth daily. 10/19/14   Dustin Flock, MD  spironolactone (ALDACTONE) 50 MG tablet Take 1 tablet (50 mg total) by mouth daily. 10/19/14   Dustin Flock, MD  thiamine 100 MG tablet Take 100 mg by mouth daily.    [provider]  vitamin A 10000 UNIT capsule Take 10,000 Units by mouth daily.    [provider]    Allergies Patient has no known allergies.  Family History  Problem Relation Age of Onset  . Hypertension Father     Social History Social History   Tobacco Use  . Smoking status: Never Smoker  . Smokeless tobacco: Never Used  Substance Use Topics  . Alcohol use: Yes    Alcohol/week: 0.0 standard drinks    Comment: has stopped all alcohol intake  . Drug use: No    Review of Systems Constitutional: No fever/chills Eyes: No visual changes. ENT: No sore throat. Cardiovascular: Denies chest pain. Respiratory: Denies shortness of breath. Gastrointestinal: Positive  for abdominal pain nausea and vomiting Genitourinary: Negative for dysuria. Musculoskeletal: Negative for neck pain.  Negative for back pain. Integumentary: Negative for rash. Neurological: Negative for headaches, focal weakness or numbness.   ____________________________________________   PHYSICAL EXAM:  VITAL SIGNS: ED Triage Vitals  Enc Vitals Group     BP 04/25/19 2155 (!) 145/75     Pulse Rate 04/25/19 2155 68     Resp 04/25/19 2155 18     Temp 04/25/19 2155 97.7 F (36.5  C)     Temp Source 04/25/19 2155 Oral     SpO2 04/25/19 2155 100 %     Weight 04/25/19 2154 97.5 kg (215 lb)     Height 04/25/19 2154 1.854 m (6\' 1" )     Head Circumference --      Peak Flow --      Pain Score 04/25/19 2153 10     Pain Loc --      Pain Edu? --      Excl. in [place]? --     Constitutional: Alert and oriented.  Eyes: Conjunctivae are normal.  Mouth/Throat: Patient is wearing a mask. Neck: No stridor.  No meningeal signs.   Cardiovascular: Normal rate, regular rhythm. Good peripheral circulation. Grossly normal heart sounds. Respiratory: Normal respiratory effort.  No retractions. Gastrointestinal: Generalized tenderness to palpation.  Bulging protuberance from the patient's umbilicus that is hard to palpation. Musculoskeletal: No lower extremity tenderness nor edema. No gross deformities of extremities. Neurologic:  Normal speech and language. No gross focal neurologic deficits are appreciated.  Skin:  Skin is warm, dry and intact. Psychiatric: Mood and affect are normal. Speech and behavior are normal.  ____________________________________________   LABS (all labs ordered are listed, but only abnormal results are displayed)  Labs Reviewed  CBC WITH DIFFERENTIAL/PLATELET - Abnormal; Notable for the following components:      Result Value   Hemoglobin 11.1 (*)    HCT 36.3 (*)    MCV 67.5 (*)    MCH 20.6 (*)    RDW 21.7 (*)    All other components within normal limits  COMPREHENSIVE METABOLIC PANEL - Abnormal; Notable for the following components:   CO2 21 (*)    Glucose, Bld 133 (*)    Calcium 8.8 (*)    Total Bilirubin 1.4 (*)    All other components within normal limits  SARS CORONAVIRUS 2 (TAT 6-24 HRS)  LIPASE, BLOOD  PROTIME-INR   ____________________________________________  EKG  ED ECG REPORT I, [place] N Jerel Sardina, the attending physician, personally viewed and interpreted this ECG.   Date: 04/25/2019  EKG Time: 10:07 PM  Rate: 65  Rhythm:  Normal sinus rhythm  Axis: Normal  Intervals: Normal  ST&T Change: None  ____________________________________________  RADIOLOGY I, [place] N Aylah Yeary, personally viewed and evaluated these images (plain radiographs) as part of my medical decision making, as well as reviewing the written report by the radiologist.  ED MD interpretation: Changes consistent with umbilical hernia with incarcerated small bowel and a proximal partial small bowel obstruction on CT abdomen pelvis impression per radiologist.  Official radiology report(s): CT ABDOMEN PELVIS W CONTRAST  Result Date: 04/26/2019 CLINICAL DATA:  Abdominal pain with bulging umbilical hernia EXAM: CT ABDOMEN AND PELVIS WITH CONTRAST TECHNIQUE: Multidetector CT imaging of the abdomen and pelvis was performed using the standard protocol following bolus administration of intravenous contrast. CONTRAST:  161mL OMNIPAQUE IOHEXOL 300 MG/ML  SOLN COMPARISON:  10/17/2014 FINDINGS: Lower chest: No acute abnormality. Hepatobiliary: Liver demonstrates some  diffuse heterogeneity without focal mass. Nodularity is noted consistent with cirrhosis. Gallbladder is well distended with dependent gallstones. Pancreas: Unremarkable. No pancreatic ductal dilatation or surrounding inflammatory changes. Spleen: Normal in size without focal abnormality. Adrenals/Urinary Tract: Adrenal glands are within normal limits. Kidneys demonstrate a normal enhancement pattern bilaterally. No renal calculi or urinary tract obstructive changes are seen. Normal excretion contrast material is seen. The bladder is decompressed. Stomach/Bowel: The colon is well visualized and predominately decompressed. Scattered diverticular changes noted without diverticulitis. The appendix is within normal limits. The stomach is unremarkable. In the distal jejunum/proximal ileum, there is early small-bowel dilatation identified. Multiple dilated loops of small bowel are identified within a umbilical hernia.  A small amount of ascites is noted within the hernia as well. The obstruction proximal to this is related to the herniated bowel loops. The more distal ileum is within normal limits. Vascular/Lymphatic: Aortic atherosclerosis. No enlarged abdominal or pelvic lymph nodes. Reproductive: Prostate is unremarkable. Other: Mild ascites is noted. This is less than that seen on the prior exam. Musculoskeletal: Degenerative changes of lumbar spine are seen. No acute bony abnormality is noted. IMPRESSION: Changes consistent with umbilical hernia with incarcerated small bowel and proximal partial small bowel obstruction. Changes consistent with cirrhosis of the liver with diffuse heterogeneity. No definitive mass lesion is seen. Associated ascites is noted. Cholelithiasis without complicating factors. Electronically Signed   By: Inez Catalina M.D.   On: 04/26/2019 00:01     .Critical Care Performed by: Gregor Hams, MD Authorized by: Gregor Hams, MD   Critical care provider statement:    Critical care time (minutes):  30   Critical care time was exclusive of:  Separately billable procedures and treating other patients (Incarcerated umbilical hernia)   Critical care was time spent personally by me on the following activities:  Development of treatment plan with patient or surrogate, discussions with consultants, evaluation of patient's response to treatment, examination of patient, obtaining history from patient or surrogate, ordering and performing treatments and interventions, ordering and review of laboratory studies, ordering and review of radiographic studies, pulse oximetry, re-evaluation of patient's condition and review of old charts     ____________________________________________   [place] / MDM / [place] / ED COURSE  As part of my medical decision making, I reviewed the following data within the electronic MEDICAL RECORD NUMBER  12:35 AM 04/26/2019  Patient discussed  with Dr. Dahlia Byes general surgeon on-call who requested that the patient be transferred to [facility] given complexity of the case and [facility]'s familiarity with the patient.  As such I discussed the patient with the [facility] transfer center informed me that they were at capacity at [facility] and [facility].  Transfer center states that they would give me a call back with a surgeon on the line.  1:14 AM  Notify Dr. Dahlia Byes of the capacity situation at [facility] and I am awaiting a call back from the transfer center.    1:35 AM:  Patient discussed with Dr. Doyne Keel general surgeon on-call at [facility] who accepted the patient in transfer.     ____________________________________________  FINAL CLINICAL IMPRESSION(S) / ED DIAGNOSES  Final diagnoses:  Incarcerated umbilical hernia  Small bowel obstruction ([place])     MEDICATIONS GIVEN DURING THIS VISIT:  Medications  HYDROmorphone (DILAUDID) injection 1 mg (1 mg Intravenous Given 04/25/19 2311)  ondansetron (ZOFRAN) injection 4 mg (4 mg Intravenous Given 04/25/19 2311)  iohexol (OMNIPAQUE) 300 MG/ML solution 100 mL (100 mLs Intravenous  Contrast Given 04/25/19 2345)  morphine 4 MG/ML injection 4 mg (4 mg Intravenous Given 04/26/19 [id number])     ED Discharge Orders    None      *Please note:  KALAB FIDEL was evaluated in Emergency Department on 04/26/2019 for the symptoms described in the history of present illness. He was evaluated in the context of the global COVID-19 pandemic, which necessitated consideration that the patient might be at risk for infection with the SARS-CoV-2 virus that causes COVID-19. Institutional protocols and algorithms that pertain to the evaluation of patients at risk for COVID-19 are in a state of rapid change based on information released by regulatory bodies including the CDC and federal and state organizations. These policies and algorithms were followed during the patient's care in the ED.  Some ED evaluations and interventions may be  delayed as a result of limited staffing during the pandemic.*  Note:  This document was prepared using Dragon voice recognition software and may include unintentional dictation errors.   Gregor Hams, MD 04/26/19 [phone]

## 2019-04-26 LAB — CBC WITH DIFFERENTIAL/PLATELET
Abs Immature Granulocytes: 0.04 10*3/uL (ref 0.00–0.07)
Basophils Absolute: 0 10*3/uL (ref 0.0–0.1)
Basophils Relative: 0 %
Eosinophils Absolute: 0 10*3/uL (ref 0.0–0.5)
Eosinophils Relative: 0 %
HCT: 36.3 % — ABNORMAL LOW (ref 39.0–52.0)
Hemoglobin: 11.1 g/dL — ABNORMAL LOW (ref 13.0–17.0)
Immature Granulocytes: 0 %
Lymphocytes Relative: 10 %
Lymphs Abs: 0.9 10*3/uL (ref 0.7–4.0)
MCH: 20.6 pg — ABNORMAL LOW (ref 26.0–34.0)
MCHC: 30.6 g/dL (ref 30.0–36.0)
MCV: 67.5 fL — ABNORMAL LOW (ref 80.0–100.0)
Monocytes Absolute: 0.6 10*3/uL (ref 0.1–1.0)
Monocytes Relative: 6 %
Neutro Abs: 7.5 10*3/uL (ref 1.7–7.7)
Neutrophils Relative %: 84 %
Platelets: 381 10*3/uL (ref 150–400)
RBC: 5.38 MIL/uL (ref 4.22–5.81)
RDW: 21.7 % — ABNORMAL HIGH (ref 11.5–15.5)
WBC: 9.1 10*3/uL (ref 4.0–10.5)
nRBC: 0 % (ref 0.0–0.2)

## 2019-04-26 LAB — SARS CORONAVIRUS 2 (TAT 6-24 HRS): SARS Coronavirus 2: NEGATIVE

## 2019-04-26 MED ORDER — MORPHINE SULFATE (PF) 4 MG/ML IV SOLN
4.0000 mg | Freq: Once | INTRAVENOUS | Status: AC
  Administered 2019-04-26: 4 mg via INTRAVENOUS
  Filled 2019-04-26: qty 1

## 2019-04-26 NOTE — ED Notes (Signed)
Pt signed physical copy of transfer consent form. Physical copy placed in box to go to records.

## 2020-05-28 ENCOUNTER — Ambulatory Visit: Payer: BC Managed Care – PPO | Admitting: Dermatology

## 2020-05-28 ENCOUNTER — Other Ambulatory Visit: Payer: Self-pay

## 2020-05-28 DIAGNOSIS — R21 Rash and other nonspecific skin eruption: Secondary | ICD-10-CM | POA: Diagnosis not present

## 2020-05-28 MED ORDER — MUPIROCIN 2 % EX OINT: 1.0000 "application " | TOPICAL_OINTMENT | Freq: Every day | CUTANEOUS | 2 refills | Status: DC

## 2020-05-28 NOTE — Patient Instructions (Addendum)
Start mupirocin daily to open areas once daily after washing and cover with band aid.  Recommend N-acetylcysteine (NAC) 1000 mg tablets. Start one daily for one week, then increase to one twice a day for one week, then increase to one three times per day if tolerated.   Recommend daily broad spectrum sunscreen SPF 30+ to sun-exposed areas, reapply every 2 hours as needed. Call for new or changing lesions.  Staying in the shade or wearing long sleeves, sun glasses (UVA+UVB protection) and wide brim hats (4-inch brim around the entire circumference of the hat) are also recommended for sun protection.   If you have any questions or concerns for your doctor, please call our main line at [phone] and press option 4 to reach your doctor's medical assistant. If no one answers, please leave a voicemail as directed and we will return your call as soon as possible. Messages left after 4 pm will be answered the following business day.   You may also send [place] a message via [place]. We typically respond to MyChart messages within 1-2 business days.  For prescription refills, please ask your pharmacy to contact our office. Our fax number is [phone].  If you have an urgent issue when the clinic is closed that cannot wait until the next business day, you can page your doctor at the number below.    Please note that while we do our best to be available for urgent issues outside of office hours, we are not available 24/7.   If you have an urgent issue and are unable to reach [place], you may choose to seek medical care at your doctor's office, retail clinic, urgent care center, or emergency room.  If you have a medical emergency, please immediately call 911 or go to the emergency department.  Pager Numbers  - Dr. Nehemiah Massed: [phone]  - Dr. Laurence Ferrari: [phone]  - Dr. Nicole Kindred: [phone]  In the event of inclement weather, please call our main line at [phone] for an update on the status of any  delays or closures.  Dermatology Medication Tips: Please keep the boxes that topical medications come in in order to help keep track of the instructions about where and how to use these. Pharmacies typically print the medication instructions only on the boxes and not directly on the medication tubes.   If your medication is too expensive, please contact our office at [phone] option 4 or send [place] a message through [place].   We are unable to tell what your co-pay for medications will be in advance as this is different depending on your insurance coverage. However, we may be able to find a substitute medication at lower cost or fill out paperwork to get insurance to cover a needed medication.   If a prior authorization is required to get your medication covered by your insurance company, please allow [place] 1-2 business days to complete this process.  Drug prices often vary depending on where the prescription is filled and some pharmacies may offer cheaper prices.  The website www.goodrx.com contains coupons for medications through different pharmacies. The prices here do not account for what the cost may be with help from insurance (it may be cheaper with your insurance), but the website can give you the price if you did not use any insurance.  - You can print the associated coupon and take it with your prescription to the pharmacy.  - You may also stop by our office during regular business hours and pick up a  GoodRx coupon card.  - If you need your prescription sent electronically to a different pharmacy, notify our office through [facility] or by phone at [phone] option 4.

## 2020-05-28 NOTE — Progress Notes (Signed)
   New Patient Visit  Subjective  Ronnie Cohen is a 55 y.o. male who presents for the following: Skin Problem (Patient with excoriations at arms, legs and upper back. Started just over 2 months ago about the time his son passed away. The areas do not itch but once he starts to pick he just continues. ).  The following portions of the chart were reviewed this encounter and updated as appropriate:   Tobacco  Allergies  Meds  Problems  Med Hx  Surg Hx  Fam Hx      Review of Systems:  No other skin or systemic complaints except as noted in HPI or Assessment and Plan.  Objective  Well appearing patient in no apparent distress; mood and affect are within normal limits.  A focused examination was performed including arms, hands, legs, back. Relevant physical exam findings are noted in the Assessment and Plan.  Objective  arms, hands, thighs, back: Angulated excoriations left hand and forearm, right thigh, upper back   Assessment & Plan  Rash arms, hands, thighs, back  Patient already aware this is from scratching/picking.  Recommend patient start mupirocin once daily after washing and cover with band aid, keeping covered 24/7.  Recommend N-acetylcysteine (NAC) 600 mg supplement three times per day to help with picking  Also recommend counseling and support following his loss. Briefly counseled on practicing allowing the urge sensation to be there in his body rather than picking to make it go away.  Ordered Medications: mupirocin ointment (BACTROBAN) 2 %  Return in about 6 weeks (around 07/09/2020).  Graciella Belton, RMA, am acting as scribe for Forest Gleason, MD .  Documentation: I have reviewed the above documentation for accuracy and completeness, and I agree with the above.  Forest Gleason, MD

## 2020-06-09 ENCOUNTER — Encounter: Payer: Self-pay | Admitting: Dermatology

## 2020-07-09 ENCOUNTER — Encounter: Payer: Self-pay | Admitting: Dermatology

## 2020-07-09 ENCOUNTER — Other Ambulatory Visit: Payer: Self-pay

## 2020-07-09 ENCOUNTER — Ambulatory Visit: Payer: BC Managed Care – PPO | Admitting: Dermatology

## 2020-07-09 DIAGNOSIS — L280 Lichen simplex chronicus: Secondary | ICD-10-CM

## 2020-07-09 MED ORDER — CLOBETASOL PROPIONATE 0.05 % EX OINT: 1.0000 "application " | TOPICAL_OINTMENT | Freq: Every day | CUTANEOUS | 1 refills | Status: DC

## 2020-07-09 NOTE — Progress Notes (Signed)
   Follow-Up Visit   Subjective  Ronnie Cohen is a 55 y.o. male who presents for the following: Rash (6 week recheck. Has not started NAC-600mg . Using Mupirocin oint as directed. Thinks areas have improved. Arms, legs, upper back. Has tried to avoid picking/scratching areas.).   The following portions of the chart were reviewed this encounter and updated as appropriate:  Tobacco  Allergies  Meds  Problems  Med Hx  Surg Hx  Fam Hx      Review of Systems: No other skin or systemic complaints except as noted in HPI or Assessment and Plan.   Objective  Well appearing patient in no apparent distress; mood and affect are within normal limits.  A focused examination was performed including face, arms, hands. Relevant physical exam findings are noted in the Assessment and Plan.  Objective  arms, legs, upper back: With prurigo nodularis  Erythematous lichenified papules, scattered excoriations in various stages of healing.    Assessment & Plan  Lichen simplex chronicus arms, legs, upper back  Recommend N-acetylcysteine (NAC) 600 mg supplement three times per day to help with picking   Start Clobetasol ointment to itchy areas QD, cover with bandaid. Once not itchy, stop medication and just keep covered.  Avoid scratching/picking areas.  clobetasol ointment (TEMOVATE) 0.05 % - arms, legs, upper back  Return for rash recheck.   I, Emelia Salisbury, CMA, am acting as scribe for Forest Gleason, MD.  Documentation: I have reviewed the above documentation for accuracy and completeness, and I agree with the above.  Forest Gleason, MD

## 2020-07-09 NOTE — Patient Instructions (Addendum)
Topical steroids (such as triamcinolone, fluocinolone, fluocinonide, mometasone, clobetasol, halobetasol, betamethasone, hydrocortisone) can cause thinning and lightening of the skin if they are used for too long in the same area. Your physician has selected the right strength medicine for your problem and area affected on the body. Please use your medication only as directed by your physician to prevent side effects.   If you have any questions or concerns for your doctor, please call our main line at [phone] and press option 4 to reach your doctor's medical assistant. If no one answers, please leave a voicemail as directed and we will return your call as soon as possible. Messages left after 4 pm will be answered the following business day.   You may also send [place] a message via [place]. We typically respond to MyChart messages within 1-2 business days.  For prescription refills, please ask your pharmacy to contact our office. Our fax number is [phone].  If you have an urgent issue when the clinic is closed that cannot wait until the next business day, you can page your doctor at the number below.    Please note that while we do our best to be available for urgent issues outside of office hours, we are not available 24/7.   If you have an urgent issue and are unable to reach [place], you may choose to seek medical care at your doctor's office, retail clinic, urgent care center, or emergency room.  If you have a medical emergency, please immediately call 911 or go to the emergency department.  Pager Numbers  - Dr. Nehemiah Massed: [phone]  - Dr. Laurence Ferrari: [phone]  - Dr. Nicole Kindred: [phone]  In the event of inclement weather, please call our main line at [phone] for an update on the status of any delays or closures.  Dermatology Medication Tips: Please keep the boxes that topical medications come in in order to help keep track of the instructions about where and how to use these.  Pharmacies typically print the medication instructions only on the boxes and not directly on the medication tubes.   If your medication is too expensive, please contact our office at [phone] option 4 or send [place] a message through [place].   We are unable to tell what your co-pay for medications will be in advance as this is different depending on your insurance coverage. However, we may be able to find a substitute medication at lower cost or fill out paperwork to get insurance to cover a needed medication.   If a prior authorization is required to get your medication covered by your insurance company, please allow [place] 1-2 business days to complete this process.  Drug prices often vary depending on where the prescription is filled and some pharmacies may offer cheaper prices.  The website www.goodrx.com contains coupons for medications through different pharmacies. The prices here do not account for what the cost may be with help from insurance (it may be cheaper with your insurance), but the website can give you the price if you did not use any insurance.  - You can print the associated coupon and take it with your prescription to the pharmacy.  - You may also stop by our office during regular business hours and pick up a GoodRx coupon card.  - If you need your prescription sent electronically to a different pharmacy, notify our office through [facility] or by phone at [phone] option 4.  Recommend N-acetylcysteine (NAC) 600 mg supplement three times per day to  help with picking  Recommend OTC [place] Rapid Relief Anti-Itch cream (pramoxine + menthol) up to 3 times per day to areas that are itchy.

## 2020-07-14 ENCOUNTER — Encounter: Payer: Self-pay | Admitting: Dermatology

## 2020-08-28 ENCOUNTER — Ambulatory Visit: Payer: BC Managed Care – PPO | Admitting: Dermatology

## 2020-11-03 ENCOUNTER — Other Ambulatory Visit: Payer: Self-pay | Admitting: Dermatology

## 2020-11-03 DIAGNOSIS — L280 Lichen simplex chronicus: Secondary | ICD-10-CM

## 2020-11-06 ENCOUNTER — Ambulatory Visit: Payer: BC Managed Care – PPO | Admitting: Dermatology

## 2020-11-06 ENCOUNTER — Other Ambulatory Visit: Payer: Self-pay

## 2020-11-06 DIAGNOSIS — L281 Prurigo nodularis: Secondary | ICD-10-CM

## 2020-11-06 DIAGNOSIS — L280 Lichen simplex chronicus: Secondary | ICD-10-CM | POA: Diagnosis not present

## 2020-11-06 DIAGNOSIS — B353 Tinea pedis: Secondary | ICD-10-CM | POA: Diagnosis not present

## 2020-11-06 MED ORDER — CICLOPIROX OLAMINE 0.77 % EX CREA: TOPICAL_CREAM | Freq: Two times a day (BID) | CUTANEOUS | 2 refills | Status: AC

## 2020-11-06 MED ORDER — TACROLIMUS 0.1 % EX OINT: TOPICAL_OINTMENT | Freq: Every day | CUTANEOUS | 1 refills | Status: DC

## 2020-11-06 NOTE — Patient Instructions (Addendum)
Discontinue clobetasol ointment Start tacrolimus ointment daily and cover with a band aid.   Start ciclopirox cream twice daily to feet and in between toes until clear, then one more week and then once weekly for maintenance.   Recommend daily broad spectrum sunscreen SPF 30+ to sun-exposed areas, reapply every 2 hours as needed. Call for new or changing lesions.  Staying in the shade or wearing long sleeves, sun glasses (UVA+UVB protection) and wide brim hats (4-inch brim around the entire circumference of the hat) are also recommended for sun protection.   If you have any questions or concerns for your doctor, please call our main line at [phone] and press option 4 to reach your doctor's medical assistant. If no one answers, please leave a voicemail as directed and we will return your call as soon as possible. Messages left after 4 pm will be answered the following business day.   You may also send [place] a message via [place]. We typically respond to MyChart messages within 1-2 business days.  For prescription refills, please ask your pharmacy to contact our office. Our fax number is [phone].  If you have an urgent issue when the clinic is closed that cannot wait until the next business day, you can page your doctor at the number below.    Please note that while we do our best to be available for urgent issues outside of office hours, we are not available 24/7.   If you have an urgent issue and are unable to reach [place], you may choose to seek medical care at your doctor's office, retail clinic, urgent care center, or emergency room.  If you have a medical emergency, please immediately call 911 or go to the emergency department.  Pager Numbers  - Dr. Nehemiah Massed: [phone]  - Dr. Laurence Ferrari: [phone]  - Dr. Nicole Kindred: [phone]  In the event of inclement weather, please call our main line at [phone] for an update on the status of any delays or closures.  Dermatology  Medication Tips: Please keep the boxes that topical medications come in in order to help keep track of the instructions about where and how to use these. Pharmacies typically print the medication instructions only on the boxes and not directly on the medication tubes.   If your medication is too expensive, please contact our office at [phone] option 4 or send [place] a message through [facility].   We are unable to tell what your co-pay for medications will be in advance as this is different depending on your insurance coverage. However, we may be able to find a substitute medication at lower cost or fill out paperwork to get insurance to cover a needed medication.   If a prior authorization is required to get your medication covered by your insurance company, please allow [place] 1-2 business days to complete this process.  Drug prices often vary depending on where the prescription is filled and some pharmacies may offer cheaper prices.  The website www.goodrx.com contains coupons for medications through different pharmacies. The prices here do not account for what the cost may be with help from insurance (it may be cheaper with your insurance), but the website can give you the price if you did not use any insurance.  - You can print the associated coupon and take it with your prescription to the pharmacy.  - You may also stop by our office during regular business hours and pick up a GoodRx coupon card.  - If you need your prescription  sent electronically to a different pharmacy, notify our office through [facility] or by phone at [phone] option 4.

## 2020-11-06 NOTE — Progress Notes (Signed)
Entered in error

## 2020-11-06 NOTE — Progress Notes (Signed)
   Follow-Up Visit   Subjective  Ronnie Cohen is a 55 y.o. male who presents for the following: Follow-up (Patient here today for [facility] follow up at [facility]. Patient has used mupirocin and more recently clobetasol ointment. Patient advises he has had improvement. He was not able to find N-acetylcysteine (NAC) 600 mg supplement.).    The following portions of the chart were reviewed this encounter and updated as appropriate:   Tobacco  Allergies  Meds  Problems  Med Hx  Surg Hx  Fam Hx      Review of Systems:  No other skin or systemic complaints except as noted in HPI or Assessment and Plan.  Objective  Well appearing patient in no apparent distress; mood and affect are within normal limits.  A focused examination was performed including arms, hands, legs and feet. Relevant physical exam findings are noted in the Assessment and Plan.  bilateral forearms, legs Lichenified plaques with excoriations  right hand x 6, right forearm x 5, right elbow x 1, left forearm x 3, left hand x 2, left calf x 1, right medial thigh x 1 (19) Thicker lichenified plaques and few nodules  bilateral feet Scaling and maceration web spaces and over distal and lateral soles.    Assessment & Plan  Lichen simplex chronicus bilateral forearms, legs  Discontinue clobetasol ointment Start tacrolimus ointment daily and cover with a band aid.   tacrolimus (PROTOPIC) 0.1 % ointment - bilateral forearms, legs Apply topically daily. Apply daily and cover with band aid.  Related Medications clobetasol ointment (TEMOVATE) [id number] % APPLY 1 APPLICATION TOPICALLY DAILY. 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 Recommend Vaseline ointment to treated areas  while healing.   Destruction of lesion - right hand x 6, right forearm x 5, right elbow x 1, left forearm x 3, left hand x 2, left calf x 1, right medial thigh x 1  Destruction method: cryotherapy   Informed consent: discussed and consent obtained   Lesion destroyed using liquid nitrogen: Yes   Cryotherapy cycles:  2 Outcome: patient tolerated procedure well with no complications   Post-procedure details: wound care instructions given    Tinea pedis of both feet bilateral feet  Start ciclopirox cream twice daily to feet and in between toes until clear then one more week and then once weekly for maintenance.   ciclopirox (LOPROX) 0.77 % cream - bilateral feet Apply topically 2 (two) times daily. Apply to feet, in between toes until clear then one more week and then once weekly for maintenance.  Return in about 6 weeks (around 12/18/2020).  Graciella Belton, RMA, am acting as scribe for Forest Gleason, MD .  Documentation: I have reviewed the above documentation for accuracy and completeness, and I agree with the above.  Forest Gleason, MD

## 2020-11-17 ENCOUNTER — Encounter: Payer: Self-pay | Admitting: Dermatology

## 2021-03-21 ENCOUNTER — Other Ambulatory Visit: Payer: Self-pay | Admitting: Dermatology

## 2021-03-21 DIAGNOSIS — L280 Lichen simplex chronicus: Secondary | ICD-10-CM

## 2021-06-01 ENCOUNTER — Ambulatory Visit: Payer: BC Managed Care – PPO | Admitting: Dermatology

## 2021-06-01 DIAGNOSIS — L209 Atopic dermatitis, unspecified: Secondary | ICD-10-CM | POA: Diagnosis not present

## 2021-06-01 DIAGNOSIS — L281 Prurigo nodularis: Secondary | ICD-10-CM | POA: Diagnosis not present

## 2021-06-01 DIAGNOSIS — L280 Lichen simplex chronicus: Secondary | ICD-10-CM | POA: Diagnosis not present

## 2021-06-01 MED ORDER — OPZELURA 1.5 % EX CREA: 1.0000 "application " | TOPICAL_CREAM | CUTANEOUS | 5 refills | Status: AC

## 2021-06-01 MED ORDER — TACROLIMUS 0.1 % EX OINT: TOPICAL_OINTMENT | Freq: Every day | CUTANEOUS | 1 refills | Status: DC

## 2021-06-01 MED ORDER — OPZELURA 1.5 % EX CREA: 1.0000 "application " | TOPICAL_CREAM | CUTANEOUS | 5 refills | Status: DC

## 2021-06-01 NOTE — Progress Notes (Signed)
? ?  Follow-Up Visit ?  ?Subjective  ?Ronnie Cohen is a 56 y.o. male who presents for the following: lichen simplex chronicus (Patient here today concerning a spot at right forearm. Patient has been using tacrolimus ointment to affected areas at arms and legs. 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 Using tacrolimus ointment to affected areas. Still has active areas at right forearm.). ?The patient has spots, moles and lesions to be evaluated, some may be new or changing and the patient has concerns that these could be cancer. ? ?The following portions of the chart were reviewed this encounter and updated as appropriate:  Tobacco  Allergies  Meds  Problems  Med Hx  Surg Hx  Fam Hx   ?  ?Review of Systems: No other skin or systemic complaints except as noted in HPI or Assessment and Plan. ? ?Objective  ?Well appearing patient in no apparent distress; mood and affect are within normal limits. ? ?A focused examination was performed including bilateral arms, legs, . Relevant physical exam findings are noted in the Assessment and Plan. ? ?b/l arms and legs ?Pink crusted plaques  ? ?b/l arm, legs, back ?15 active pink crusted papules at arms, legs, back ? ? ? ? ? ? ? ? ? ? ? ?Assessment & Plan  ?Atopic dermatitis, unspecified type ?With lichen simplex chronicus and prurigo nodularis ?b/l arms and legs ?Chronic and persistent condition with duration or expected duration over one year. Condition is symptomatic / bothersome to patient. Not to goal. ?Continue tacrolimus ointment daily and cover with a band aid.  ?Start Opzelura 1.5 % cream apply 1 - 2 times daily to affected areas at arms, legs, and back. ? ?Consider confirmatory biopsy at follow-up ?Will consider Dupixent in future if not responding to treatment  ? ?tacrolimus (PROTOPIC) 0.1 % ointment ?Apply topically daily. Apply daily and cover with band aid. ?Ruxolitinib Phosphate (OPZELURA) 1.5 % CREA - b/l arm, legs, back ?Apply 1  application. topically See admin instructions. Use 1 - 2 times daily to affected areas at arms, legs, back ? ?Return for 2 - 3 month lsc and prurigo nodularis. ?I, Ruthell Rummage, CMA, am acting as scribe for Sarina Ser, MD. ?Documentation: I have reviewed the above documentation for accuracy and completeness, and I agree with the above. ? ?Sarina Ser, MD ? ?

## 2021-06-01 NOTE — Patient Instructions (Addendum)

## 2021-06-02 ENCOUNTER — Encounter: Payer: Self-pay | Admitting: Dermatology

## 2021-08-03 ENCOUNTER — Ambulatory Visit: Payer: BC Managed Care – PPO | Admitting: Dermatology

## 2021-08-03 ENCOUNTER — Encounter: Payer: Self-pay | Admitting: Dermatology

## 2021-08-03 DIAGNOSIS — L2081 Atopic neurodermatitis: Secondary | ICD-10-CM | POA: Diagnosis not present

## 2021-08-03 DIAGNOSIS — L280 Lichen simplex chronicus: Secondary | ICD-10-CM

## 2021-08-03 MED ORDER — TACROLIMUS 0.1 % EX OINT: TOPICAL_OINTMENT | CUTANEOUS | 11 refills | Status: DC

## 2021-08-03 NOTE — Progress Notes (Signed)
   Follow-Up Visit   Subjective  Ronnie Cohen is a 56 y.o. male who presents for the following: Rash (Recheck LSC and Prurigo nodularis. Using Tacrolimus. Patient reports he has finally stopped scratching. Looks better now than has in a long time. Was unable to get Opzelura.  Patient never started NAC suppliment recommended by Dr. Laurence Ferrari. The patient has spots, moles and lesions to be evaluated, some may be new or changing and the patient has concerns that these could be cancer.  The following portions of the chart were reviewed this encounter and updated as appropriate:  Tobacco  Allergies  Meds  Problems  Med Hx  Surg Hx  Fam Hx     Review of Systems: No other skin or systemic complaints except as noted in HPI or Assessment and Plan.  Objective  Well appearing patient in no apparent distress; mood and affect are within normal limits.  A focused examination was performed including arms, legs, face. Relevant physical exam findings are noted in the Assessment and Plan.  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 Condition is symptomatic / bothersome to patient. Not to goal. Atopic dermatitis (eczema) is a chronic, relapsing, pruritic condition that can significantly affect quality of life. It is often associated with allergic rhinitis and/or asthma and can require treatment with topical medications, phototherapy, or in severe cases biologic injectable medication (Dupixent; Adbry) or Oral JAK inhibitors.  Discussed Dupixent injections.  Patient prefers to continue Tacrolimus ointment for now since condition appears to be improving.  Continue Tacrolimus  0.1% ointment as directed.  May cover spots with Band-Aids to prevent picking and to keep the medication on.  Related Medications clobetasol ointment (TEMOVATE) 6.76 % APPLY 1 APPLICATION TOPICALLY DAILY. COVER WITH BANDAID tacrolimus (PROTOPIC) 0.1 % ointment Apply daily and cover with band aid.  Return in about 6 months (around 02/02/2022) for LSC/Prurigo recheck.  I, Emelia Salisbury, CMA, am acting as scribe for Sarina Ser, MD. Documentation: I have reviewed the above documentation for accuracy and completeness, and I agree with the above.  Sarina Ser, MD

## 2021-08-03 NOTE — Patient Instructions (Addendum)
Continue Tacrolimus ointment as directed.    Avoid picking/scratching areas.   Okay to use Clobetasol ointment on poison ivy rash.    Due to recent changes in healthcare laws, you may see results of your pathology and/or laboratory studies on MyChart before the doctors have had a chance to review them. We understand that in some cases there may be results that are confusing or concerning to you. Please understand that not all results are received at the same time and often the doctors may need to interpret multiple results in order to provide you with the best plan of care or course of treatment. Therefore, we ask that you please give [place] 2 business days to thoroughly review all your results before contacting the office for clarification. Should we see a critical lab result, you will be contacted sooner.   If You Need Anything After Your Visit  If you have any questions or concerns for your doctor, please call our main line at [phone] and press option 4 to reach your doctor's medical assistant. If no one answers, please leave a voicemail as directed and we will return your call as soon as possible. Messages left after 4 pm will be answered the following business day.   You may also send [place] a message via [place]. We typically respond to MyChart messages within 1-2 business days.  For prescription refills, please ask your pharmacy to contact our office. Our fax number is [phone].  If you have an urgent issue when the clinic is closed that cannot wait until the next business day, you can page your doctor at the number below.    Please note that while we do our best to be available for urgent issues outside of office hours, we are not available 24/7.   If you have an urgent issue and are unable to reach [place], you may choose to seek medical care at your doctor's office, retail clinic, urgent care center, or emergency room.  If you have a medical emergency, please immediately call 911 or go  to the emergency department.  Pager Numbers  - Dr. Nehemiah Massed: [phone]  - Dr. Laurence Ferrari: [phone]  - Dr. Nicole Kindred: [phone]  In the event of inclement weather, please call our main line at [phone] for an update on the status of any delays or closures.  Dermatology Medication Tips: Please keep the boxes that topical medications come in in order to help keep track of the instructions about where and how to use these. Pharmacies typically print the medication instructions only on the boxes and not directly on the medication tubes.   If your medication is too expensive, please contact our office at [phone] option 4 or send [place] a message through [place].   We are unable to tell what your co-pay for medications will be in advance as this is different depending on your insurance coverage. However, we may be able to find a substitute medication at lower cost or fill out paperwork to get insurance to cover a needed medication.   If a prior authorization is required to get your medication covered by your insurance company, please allow [place] 1-2 business days to complete this process.  Drug prices often vary depending on where the prescription is filled and some pharmacies may offer cheaper prices.  The website www.goodrx.com contains coupons for medications through different pharmacies. The prices here do not account for what the cost may be with help from insurance (it may be cheaper with your insurance), but the  website can give you the price if you did not use any insurance.  - You can print the associated coupon and take it with your prescription to the pharmacy.  - You may also stop by our office during regular business hours and pick up a GoodRx coupon card.  - If you need your prescription sent electronically to a different pharmacy, notify our office through [facility] or by phone at [phone] option 4.     Si Usted Necesita Algo Despus de Su Visita  Tambin  puede enviarnos un mensaje a travs de Pharmacist, community. Por lo general respondemos a los mensajes de MyChart en el transcurso de 1 a 2 das hbiles.  Para renovar recetas, por favor pida a su farmacia que se ponga en contacto con nuestra oficina. Harland Dingwall de fax es [place] [phone].  Si tiene un asunto urgente cuando la clnica est cerrada y que no puede esperar hasta el siguiente da hbil, puede llamar/localizar a su doctor(a) al nmero que aparece a continuacin.   Por favor, tenga en cuenta que aunque hacemos todo lo posible para estar disponibles para asuntos urgentes fuera del horario de [place], no estamos disponibles las 24 horas del da, los 7 das de la [place].   Si tiene un problema urgente y no puede comunicarse con nosotros, puede optar por buscar atencin mdica  en el consultorio de su doctor(a), en una clnica privada, en un centro de atencin urgente o en una sala de emergencias.  Si tiene Engineering geologist, por favor llame inmediatamente al 911 o vaya a la sala de emergencias.  Nmeros de bper  - Dr. Nehemiah Massed: [phone]  - Dra. Moye: [phone]  - Dra. Nicole Kindred: [phone]  En caso de inclemencias del [place], por favor llame a Johnsie Kindred principal al [phone] para una actualizacin sobre el [place] de cualquier retraso o cierre.  Consejos para la medicacin en dermatologa: Por favor, guarde las cajas en las que vienen los medicamentos de uso tpico para ayudarle a seguir las instrucciones sobre dnde y cmo usarlos. Las farmacias generalmente imprimen las instrucciones del medicamento slo en las cajas y no directamente en los tubos del [place].   Si su medicamento es muy caro, por favor, pngase en contacto con Zigmund Daniel llamando al [phone] y presione la opcin 4 o envenos un mensaje a travs de Pharmacist, community.   No podemos decirle cul ser su copago por los medicamentos por adelantado ya que esto es diferente dependiendo de la cobertura de su  seguro. Sin embargo, es posible que podamos encontrar un medicamento sustituto a Electrical engineer un formulario para que el seguro cubra el medicamento que se considera necesario.   Si se requiere una autorizacin previa para que su compaa de seguros [place] su medicamento, por favor permtanos de 1 a 2 das hbiles para completar este proceso.  Los precios de los medicamentos varan con frecuencia dependiendo del Environmental consultant de dnde se surte la receta y alguna farmacias pueden ofrecer precios ms baratos.  El sitio web www.goodrx.com tiene cupones para medicamentos de Airline pilot. Los precios aqu no tienen en cuenta lo que podra costar con la ayuda del seguro (puede ser ms barato con su seguro), pero el sitio web puede darle el precio si no utiliz Research scientist (physical sciences).  - Puede imprimir el cupn correspondiente y llevarlo con su receta a la farmacia.  - Tambin puede pasar por nuestra oficina durante el horario de atencin regular y Charity fundraiser una tarjeta de cupones de GoodRx.  -  Si necesita que su receta se enve electrnicamente a una farmacia diferente, informe a nuestra oficina a travs de MyChart de [place] o por telfono llamando al [phone] y presione la opcin 4.  

## 2022-01-19 ENCOUNTER — Ambulatory Visit: Payer: BC Managed Care – PPO | Admitting: Dermatology

## 2022-01-19 DIAGNOSIS — L281 Prurigo nodularis: Secondary | ICD-10-CM | POA: Diagnosis not present

## 2022-01-19 DIAGNOSIS — L308 Other specified dermatitis: Secondary | ICD-10-CM | POA: Diagnosis not present

## 2022-01-19 DIAGNOSIS — L739 Follicular disorder, unspecified: Secondary | ICD-10-CM | POA: Diagnosis not present

## 2022-01-19 DIAGNOSIS — Z79899 Other long term (current) drug therapy: Secondary | ICD-10-CM | POA: Diagnosis not present

## 2022-01-19 MED ORDER — DUPIXENT 300 MG/2ML ~~LOC~~ SOSY: 300.0000 mg | PREFILLED_SYRINGE | SUBCUTANEOUS | 2 refills | Status: DC

## 2022-01-19 MED ORDER — DUPIXENT 300 MG/2ML ~~LOC~~ SOSY: 600.0000 mg | PREFILLED_SYRINGE | Freq: Once | SUBCUTANEOUS | 0 refills | Status: AC

## 2022-01-19 NOTE — Patient Instructions (Addendum)
Recommend N-acetylcysteine (NAC) 600 mg supplement three times per day to help with picking   Start over the counter CLN sports wash daily   Due to recent changes in healthcare laws, you may see results of your pathology and/or laboratory studies on MyChart before the doctors have had a chance to review them. We understand that in some cases there may be results that are confusing or concerning to you. Please understand that not all results are received at the same time and often the doctors may need to interpret multiple results in order to provide you with the best plan of care or course of treatment. Therefore, we ask that you please give [place] 2 business days to thoroughly review all your results before contacting the office for clarification. Should we see a critical lab result, you will be contacted sooner.   If You Need Anything After Your Visit  If you have any questions or concerns for your doctor, please call our main line at [phone] and press option 4 to reach your doctor's medical assistant. If no one answers, please leave a voicemail as directed and we will return your call as soon as possible. Messages left after 4 pm will be answered the following business day.   You may also send [place] a message via [place]. We typically respond to MyChart messages within 1-2 business days.  For prescription refills, please ask your pharmacy to contact our office. Our fax number is [phone].  If you have an urgent issue when the clinic is closed that cannot wait until the next business day, you can page your doctor at the number below.    Please note that while we do our best to be available for urgent issues outside of office hours, we are not available 24/7.   If you have an urgent issue and are unable to reach [place], you may choose to seek medical care at your doctor's office, retail clinic, urgent care center, or emergency room.  If you have a medical emergency, please immediately call 911 or  go to the emergency department.  Pager Numbers  - Dr. Nehemiah Massed: [phone]  - Dr. Laurence Ferrari: [phone]  - Dr. Nicole Kindred: [phone]  In the event of inclement weather, please call our main line at [phone] for an update on the status of any delays or closures.  Dermatology Medication Tips: Please keep the boxes that topical medications come in in order to help keep track of the instructions about where and how to use these. Pharmacies typically print the medication instructions only on the boxes and not directly on the medication tubes.   If your medication is too expensive, please contact our office at [phone] option 4 or send [place] a message through [place].   We are unable to tell what your co-pay for medications will be in advance as this is different depending on your insurance coverage. However, we may be able to find a substitute medication at lower cost or fill out paperwork to get insurance to cover a needed medication.   If a prior authorization is required to get your medication covered by your insurance company, please allow [place] 1-2 business days to complete this process.  Drug prices often vary depending on where the prescription is filled and some pharmacies may offer cheaper prices.  The website www.goodrx.com contains coupons for medications through different pharmacies. The prices here do not account for what the cost may be with help from insurance (it may be cheaper with your insurance), but  the website can give you the price if you did not use any insurance.  - You can print the associated coupon and take it with your prescription to the pharmacy.  - You may also stop by our office during regular business hours and pick up a GoodRx coupon card.  - If you need your prescription sent electronically to a different pharmacy, notify our office through [facility] or by phone at [phone] option 4.     Si Usted Necesita Algo Despus de Su Visita  Tambin  puede enviarnos un mensaje a travs de Pharmacist, community. Por lo general respondemos a los mensajes de MyChart en el transcurso de 1 a 2 das hbiles.  Para renovar recetas, por favor pida a su farmacia que se ponga en contacto con nuestra oficina. Harland Dingwall de fax es [place] [phone].  Si tiene un asunto urgente cuando la clnica est cerrada y que no puede esperar hasta el siguiente da hbil, puede llamar/localizar a su doctor(a) al nmero que aparece a continuacin.   Por favor, tenga en cuenta que aunque hacemos todo lo posible para estar disponibles para asuntos urgentes fuera del horario de [place], no estamos disponibles las 24 horas del da, los 7 das de la [place].   Si tiene un problema urgente y no puede comunicarse con nosotros, puede optar por buscar atencin mdica  en el consultorio de su doctor(a), en una clnica privada, en un centro de atencin urgente o en una sala de emergencias.  Si tiene Engineering geologist, por favor llame inmediatamente al 911 o vaya a la sala de emergencias.  Nmeros de bper  - Dr. Nehemiah Massed: [phone]  - Dra. Moye: [phone]  - Dra. Nicole Kindred: [phone]  En caso de inclemencias del [place], por favor llame a Johnsie Kindred principal al [phone] para una actualizacin sobre el [place] de cualquier retraso o cierre.  Consejos para la medicacin en dermatologa: Por favor, guarde las cajas en las que vienen los medicamentos de uso tpico para ayudarle a seguir las instrucciones sobre dnde y cmo usarlos. Las farmacias generalmente imprimen las instrucciones del medicamento slo en las cajas y no directamente en los tubos del [place].   Si su medicamento es muy caro, por favor, pngase en contacto con Zigmund Daniel llamando al [phone] y presione la opcin 4 o envenos un mensaje a travs de Pharmacist, community.   No podemos decirle cul ser su copago por los medicamentos por adelantado ya que esto es diferente dependiendo de la cobertura de su  seguro. Sin embargo, es posible que podamos encontrar un medicamento sustituto a Electrical engineer un formulario para que el seguro cubra el medicamento que se considera necesario.   Si se requiere una autorizacin previa para que su compaa de seguros [place] su medicamento, por favor permtanos de 1 a 2 das hbiles para completar este proceso.  Los precios de los medicamentos varan con frecuencia dependiendo del Environmental consultant de dnde se surte la receta y alguna farmacias pueden ofrecer precios ms baratos.  El sitio web www.goodrx.com tiene cupones para medicamentos de Airline pilot. Los precios aqu no tienen en cuenta lo que podra costar con la ayuda del seguro (puede ser ms barato con su seguro), pero el sitio web puede darle el precio si no utiliz Research scientist (physical sciences).  - Puede imprimir el cupn correspondiente y llevarlo con su receta a la farmacia.  - Tambin puede pasar por nuestra oficina durante el horario de atencin regular y Charity fundraiser una tarjeta de cupones de  GoodRx.  - Si necesita que su receta se enve electrnicamente a una farmacia diferente, informe a nuestra oficina a travs de MyChart de [place] o por telfono llamando al [phone] y presione la opcin 4.

## 2022-01-19 NOTE — Progress Notes (Unsigned)
   Follow-Up Visit   Subjective  Ronnie Cohen is a 56 y.o. male who presents for the following: Follow-up (5 months f/u on [facility] on his arms and legs, patient stopped Tacrolimus cream and Clobetasol ointment- no help, no current treatment. ).  The following portions of the chart were reviewed this encounter and updated as appropriate:   Tobacco  Allergies  Meds  Problems  Med Hx  Surg Hx  Fam Hx      Review of Systems:  No other skin or systemic complaints except as noted in HPI or Assessment and Plan.  Objective  Well appearing patient in no apparent distress; mood and affect are within normal limits.  A focused examination was performed including arms,legs,neck. Relevant physical exam findings are noted in the Assessment and Plan.  arms Follicular-based erythematous papules and pustules.   arms, legs, neck 20 Lichenified firm papules and plaques with excoriations             Assessment & Plan  Other eczema  Related Medications dupilumab (DUPIXENT) 300 MG/2ML prefilled syringe Inject 600 mg into the skin once for 1 dose. On day 1.  Folliculitis arms  Start otc CLN sports wash daily   Start Mupirocin ointment daily and cover with a band aid   Prurigo nodularis arms, legs, neck  With eczema  Eczema (eczema) is a chronic, relapsing, pruritic condition that can significantly affect quality of life. It is often associated with allergic rhinitis and/or asthma and can require treatment with topical medications, phototherapy, or in severe cases biologic injectable medication (Dupixent; Adbry) or Oral JAK inhibitors.   Recommend N-acetylcysteine (NAC) 600 mg supplement three times per day to help with picking   Dupilumab (Dupixent) is a treatment given by injection for adults and children with moderate-to-severe atopic dermatitis. Goal is control of skin condition, not cure. It is given as 2 injections at the first dose followed by 1 injection ever 2 weeks  thereafter.  Young children are dosed monthly.  Dupxient 600 mg/84m injectected to left arm and right arm, patient tolerated injections well NDC [address]-10LOT [id number] 04/2024  Potential side effects include allergic reaction, herpes infections, injection site reactions and conjunctivitis (inflammation of the eyes).  The use of Dupixent requires long term medication management, including periodic office visits.   Samples of [place] given use twice a day to less than 10% BSA. Discontinue when approved for dupixent.  Can layer with pimecrolimus and clobetasol twice a day.  Topical steroids (such as triamcinolone, fluocinolone, fluocinonide, mometasone, clobetasol, halobetasol, betamethasone, hydrocortisone) can cause thinning and lightening of the skin if they are used for too long in the same area. Your physician has selected the right strength medicine for your problem and area affected on the body. Please use your medication only as directed by your physician to prevent side effects.     Related Medications dupilumab (DUPIXENT) 300 MG/2ML prefilled syringe Inject 300 mg into the skin every 14 (fourteen) days. Starting at day 15 for maintenance.   Return in about 2 weeks (around 02/02/2022) for [place].  I, MMarye Round CMA, am acting as scribe for VForest Gleason MD .   Documentation: I have reviewed the above documentation for accuracy and completeness, and I agree with the above.  VForest Gleason MD

## 2022-01-20 ENCOUNTER — Encounter: Payer: Self-pay | Admitting: Dermatology

## 2022-02-01 ENCOUNTER — Ambulatory Visit: Payer: BC Managed Care – PPO | Admitting: Dermatology

## 2022-02-03 ENCOUNTER — Ambulatory Visit (INDEPENDENT_AMBULATORY_CARE_PROVIDER_SITE_OTHER): Payer: BC Managed Care – PPO

## 2022-02-03 DIAGNOSIS — L281 Prurigo nodularis: Secondary | ICD-10-CM | POA: Diagnosis not present

## 2022-02-03 MED ORDER — DUPIXENT 300 MG/2ML ~~LOC~~ SOAJ: 300.0000 mg | SUBCUTANEOUS | 0 refills | Status: DC

## 2022-02-03 NOTE — Progress Notes (Signed)
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   Dupixent '300mg'$ /77m pen injected into right upper arm. Patient tolerated well.  LOT: [id number]: 12/23/2023 NDC: [address]-27 Ronnie Cohen RMA

## 2022-02-17 ENCOUNTER — Ambulatory Visit (INDEPENDENT_AMBULATORY_CARE_PROVIDER_SITE_OTHER): Payer: BC Managed Care – PPO

## 2022-02-17 DIAGNOSIS — L281 Prurigo nodularis: Secondary | ICD-10-CM | POA: Diagnosis not present

## 2022-02-17 MED ORDER — DUPIXENT 300 MG/2ML ~~LOC~~ SOSY: 300.0000 mg | PREFILLED_SYRINGE | SUBCUTANEOUS | 0 refills | Status: DC

## 2022-02-17 NOTE — Progress Notes (Signed)
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    Dupixent '300mg'$ /57m pen injected into left  upper arm. Patient tolerated well.   LOT: [id number]: 04/2024 NDC: [address]-13  AJohnsie Kindred RMA

## 2022-03-03 ENCOUNTER — Ambulatory Visit (INDEPENDENT_AMBULATORY_CARE_PROVIDER_SITE_OTHER): Payer: BC Managed Care – PPO

## 2022-03-03 DIAGNOSIS — L281 Prurigo nodularis: Secondary | ICD-10-CM | POA: Diagnosis not present

## 2022-03-03 MED ORDER — DUPIXENT 300 MG/2ML ~~LOC~~ SOSY: 300.0000 mg | PREFILLED_SYRINGE | SUBCUTANEOUS | 0 refills | Status: DC

## 2022-03-03 NOTE — Progress Notes (Signed)
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    Dupixent '300mg'$ /8m pen injected into right upper arm. Patient tolerated well.   LOT: [id number]: 04/2024 NDC: [address]-50  AJohnsie Kindred RMA

## 2022-03-16 ENCOUNTER — Encounter: Payer: Self-pay | Admitting: Dermatology

## 2022-03-16 ENCOUNTER — Ambulatory Visit: Payer: BC Managed Care – PPO | Admitting: Dermatology

## 2022-03-16 VITALS — BP 127/80 | HR 55

## 2022-03-16 DIAGNOSIS — D2361 Other benign neoplasm of skin of right upper limb, including shoulder: Secondary | ICD-10-CM

## 2022-03-16 DIAGNOSIS — L308 Other specified dermatitis: Secondary | ICD-10-CM

## 2022-03-16 DIAGNOSIS — L280 Lichen simplex chronicus: Secondary | ICD-10-CM | POA: Diagnosis not present

## 2022-03-16 MED ORDER — DUPIXENT 300 MG/2ML ~~LOC~~ SOAJ: 300.0000 mg | SUBCUTANEOUS | 0 refills | Status: DC

## 2022-03-16 NOTE — Patient Instructions (Addendum)
Cryotherapy Aftercare  Wash gently with soap and water everyday.   Apply Vaseline and Band-Aid daily until healed.   Continue Tacrolimus ointment twice daily as directed. Apply second.  Use Opzelura cream twice daily. Apply first.   Continue Dupixent injections every 2 weeks.  Dupilumab (Dupixent) is a treatment given by injection for adults and children with moderate-to-severe atopic dermatitis. Goal is control of skin condition, not cure. It is given as 2 injections at the first dose followed by 1 injection ever 2 weeks thereafter.  Young children are dosed monthly.  Potential side effects include allergic reaction, herpes infections, injection site reactions and conjunctivitis (inflammation of the eyes).  The use of Dupixent requires long term medication management, including periodic office visits.    Gentle Skin Care Guide  1. Bathe no more than once a day.  2. Avoid bathing in hot water  3. Use a mild soap like Dove, Vanicream, Cetaphil, CeraVe. Can use Lever 2000 or Cetaphil antibacterial soap  4. Use soap only where you need it. On most days, use it under your arms, between your legs, and on your feet. Let the water rinse other areas unless visibly dirty.  5. When you get out of the bath/shower, use a towel to gently blot your skin dry, don't rub it.  6. While your skin is still a little damp, apply a moisturizing cream such as Vanicream, CeraVe, Cetaphil, Eucerin, Sarna lotion or plain Vaseline Jelly. For hands apply Neutrogena [place] Hand Cream or Excipial Hand Cream.  7. Reapply moisturizer any time you start to itch or feel dry.  8. Sometimes using free and clear laundry detergents can be helpful. Fabric softener sheets should be avoided. Downy Free & Gentle liquid, or any liquid fabric softener that is free of dyes and perfumes, it acceptable to use  9. If your doctor has given you prescription creams you may apply moisturizers over them    Due to recent changes  in healthcare laws, you may see results of your pathology and/or laboratory studies on MyChart before the doctors have had a chance to review them. We understand that in some cases there may be results that are confusing or concerning to you. Please understand that not all results are received at the same time and often the doctors may need to interpret multiple results in order to provide you with the best plan of care or course of treatment. Therefore, we ask that you please give [place] 2 business days to thoroughly review all your results before contacting the office for clarification. Should we see a critical lab result, you will be contacted sooner.   If You Need Anything After Your Visit  If you have any questions or concerns for your doctor, please call our main line at [phone] and press option 4 to reach your doctor's medical assistant. If no one answers, please leave a voicemail as directed and we will return your call as soon as possible. Messages left after 4 pm will be answered the following business day.   You may also send [place] a message via [place]. We typically respond to MyChart messages within 1-2 business days.  For prescription refills, please ask your pharmacy to contact our office. Our fax number is [phone].  If you have an urgent issue when the clinic is closed that cannot wait until the next business day, you can page your doctor at the number below.    Please note that while we do our best to be available  for urgent issues outside of office hours, we are not available 24/7.   If you have an urgent issue and are unable to reach [place], you may choose to seek medical care at your doctor's office, retail clinic, urgent care center, or emergency room.  If you have a medical emergency, please immediately call 911 or go to the emergency department.  Pager Numbers  - Dr. Nehemiah Massed: [phone]  - Dr. Laurence Ferrari: [phone]  - Dr. Nicole Kindred: [phone]  In the event of  inclement weather, please call our main line at [phone] for an update on the status of any delays or closures.  Dermatology Medication Tips: Please keep the boxes that topical medications come in in order to help keep track of the instructions about where and how to use these. Pharmacies typically print the medication instructions only on the boxes and not directly on the medication tubes.   If your medication is too expensive, please contact our office at [phone] option 4 or send [place] a message through [place].   We are unable to tell what your co-pay for medications will be in advance as this is different depending on your insurance coverage. However, we may be able to find a substitute medication at lower cost or fill out paperwork to get insurance to cover a needed medication.   If a prior authorization is required to get your medication covered by your insurance company, please allow [place] 1-2 business days to complete this process.  Drug prices often vary depending on where the prescription is filled and some pharmacies may offer cheaper prices.  The website www.goodrx.com contains coupons for medications through different pharmacies. The prices here do not account for what the cost may be with help from insurance (it may be cheaper with your insurance), but the website can give you the price if you did not use any insurance.  - You can print the associated coupon and take it with your prescription to the pharmacy.  - You may also stop by our office during regular business hours and pick up a GoodRx coupon card.  - If you need your prescription sent electronically to a different pharmacy, notify our office through [facility] or by phone at [phone] option 4.     Si Usted Necesita Algo Despus de Su Visita  Tambin puede enviarnos un mensaje a travs de Pharmacist, community. Por lo general respondemos a los mensajes de MyChart en el transcurso de 1 a 2 das hbiles.  Para renovar  recetas, por favor pida a su farmacia que se ponga en contacto con nuestra oficina. Harland Dingwall de fax es [place] [phone].  Si tiene un asunto urgente cuando la clnica est cerrada y que no puede esperar hasta el siguiente da hbil, puede llamar/localizar a su doctor(a) al nmero que aparece a continuacin.   Por favor, tenga en cuenta que aunque hacemos todo lo posible para estar disponibles para asuntos urgentes fuera del horario de [place], no estamos disponibles las 24 horas del da, los 7 das de la [place].   Si tiene un problema urgente y no puede comunicarse con nosotros, puede optar por buscar atencin mdica  en el consultorio de su doctor(a), en una clnica privada, en un centro de atencin urgente o en una sala de emergencias.  Si tiene Engineering geologist, por favor llame inmediatamente al 911 o vaya a la sala de emergencias.  Nmeros de bper  - Dr. Nehemiah Massed: [phone]  - Dra. Moye: [phone]  - Dra. Nicole Kindred: [phone]  En caso de inclemencias del [place], por favor llame a nuestra lnea principal al [phone] para una actualizacin sobre el [place] de cualquier retraso o cierre.  Consejos para la medicacin en dermatologa: Por favor, guarde las cajas en las que vienen los medicamentos de uso tpico para ayudarle a seguir las instrucciones sobre dnde y cmo usarlos. Las farmacias generalmente imprimen las instrucciones del medicamento slo en las cajas y no directamente en los tubos del [place].   Si su medicamento es muy caro, por favor, pngase en contacto con Zigmund Daniel llamando al [phone] y presione la opcin 4 o envenos un mensaje a travs de Pharmacist, community.   No podemos decirle cul ser su copago por los medicamentos por adelantado ya que esto es diferente dependiendo de la cobertura de su seguro. Sin embargo, es posible que podamos encontrar un medicamento sustituto a Electrical engineer un formulario para que el seguro cubra el medicamento  que se considera necesario.   Si se requiere una autorizacin previa para que su compaa de seguros [place] su medicamento, por favor permtanos de 1 a 2 das hbiles para completar este proceso.  Los precios de los medicamentos varan con frecuencia dependiendo del Environmental consultant de dnde se surte la receta y alguna farmacias pueden ofrecer precios ms baratos.  El sitio web www.goodrx.com tiene cupones para medicamentos de Airline pilot. Los precios aqu no tienen en cuenta lo que podra costar con la ayuda del seguro (puede ser ms barato con su seguro), pero el sitio web puede darle el precio si no utiliz Research scientist (physical sciences).  - Puede imprimir el cupn correspondiente y llevarlo con su receta a la farmacia.  - Tambin puede pasar por nuestra oficina durante el horario de atencin regular y Charity fundraiser una tarjeta de cupones de GoodRx.  - Si necesita que su receta se enve electrnicamente a una farmacia diferente, informe a nuestra oficina a travs de MyChart de [place] o por telfono llamando al [phone] y presione la opcin 4.

## 2022-03-16 NOTE — Progress Notes (Signed)
Follow-Up Visit   Subjective  Ronnie Cohen is a 57 y.o. male who presents for the following: Rash (2 month recheck. Has been using Tacrolimus. On Dupixent since 01/19/2022. Reports improvement of rash appearance but increase in itching over last few days).  The following portions of the chart were reviewed this encounter and updated as appropriate:  Tobacco  Allergies  Meds  Problems  Med Hx  Surg Hx  Fam Hx      Review of Systems: No other skin or systemic complaints except as noted in HPI or Assessment and Plan.   Objective  Well appearing patient in no apparent distress; mood and affect are within normal limits.  A focused examination was performed including right arm. Relevant physical exam findings are noted in the Assessment and Plan.  Right Forearm - Anterior Scaly erythematous papules and patches  Right Forearm - Anterior Lichenified plaques  Right Forearm - Anterior x9 (9) Scattered erythematous lichenified firm nodules, some excoriated   Assessment & Plan  Other eczema Right Forearm - Anterior  Chronic and persistent condition with duration or expected duration over one year. Condition is symptomatic/ bothersome to patient. Not currently at goal.  Continue Tacrolimus ointment twice daily. Apply second.   Start Opzelura sample twice daily. Apply first. Do not apply to more than 10% of skin.  Dupixent '300mg'$ /55m injected into left upper arm. Patient tolerated well NDC: [address]-38Lot: [id number]: 05/2024  Pt advised it can take 4 months for dupixent to give full benefit.   Atopic dermatitis (eczema) is a chronic, relapsing, pruritic condition that can significantly affect quality of life. It is often associated with allergic rhinitis and/or asthma and can require treatment with topical medications, phototherapy, or in severe cases biologic injectable medication (Dupixent; Adbry) or Oral JAK inhibitors.  Dupilumab (Dupixent) is a treatment given by  injection for adults and children with moderate-to-severe atopic dermatitis. Goal is control of skin condition, not cure. It is given as 2 injections at the first dose followed by 1 injection ever 2 weeks thereafter.  Young children are dosed monthly.  Potential side effects include allergic reaction, herpes infections, injection site reactions and conjunctivitis (inflammation of the eyes).  The use of Dupixent requires long term medication management, including periodic office visits.  He denies SEs at this time.    Dupilumab (DUPIXENT) 300 MG/2ML SOPN - Right Forearm - Anterior Inject 300 mg into the skin every 14 (fourteen) days. Starting at day 15 for maintenance.  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 Condition is symptomatic/ bothersome to patient. Not currently at goal.  Continue Tacrolimus ointment twice daily. Apply second.   Start Opzelura sample twice daily. Apply first. Do not apply to more than 10% of skin.   Related Medications clobetasol ointment (TEMOVATE) 07.16% APPLY 1 APPLICATION TOPICALLY DAILY. COVER WITH BANDAID  tacrolimus (PROTOPIC) 0.1 % ointment Apply daily and cover with band aid.  Benign neoplasm of skin of right arm (9) Right Forearm - Anterior x9  C/w inflamed prurigo nodules  Symptomatic, irritating, patient would like treated.   Prior to procedure, discussed risks of blister formation, small wound, skin dyspigmentation, or rare scar following cryotherapy. Recommend Vaseline ointment to treated areas while healing.   Destruction of lesion - Right Forearm - Anterior x9  Destruction method: cryotherapy   Informed consent: discussed and consent obtained   Lesion destroyed using liquid nitrogen: Yes  Region frozen until ice ball extended beyond lesion: Yes   Outcome: patient tolerated procedure well with no complications   Post-procedure details: wound  care instructions given   Additional details:  Prior to procedure, discussed risks of blister formation, small wound, skin dyspigmentation, or rare scar following cryotherapy. Recommend Vaseline ointment to treated areas while healing.    Return for Dupixent Injection On Nurse Schedule in 2 weeks. Rash follow up woth Dr. Laurence Ferrari in 2 months.  I, Emelia Salisbury, CMA, am acting as scribe for Forest Gleason, MD.  Documentation: I have reviewed the above documentation for accuracy and completeness, and I agree with the above.  Forest Gleason, MD

## 2022-03-29 ENCOUNTER — Other Ambulatory Visit: Payer: Self-pay | Admitting: Dermatology

## 2022-03-29 DIAGNOSIS — L280 Lichen simplex chronicus: Secondary | ICD-10-CM

## 2022-03-30 ENCOUNTER — Ambulatory Visit (INDEPENDENT_AMBULATORY_CARE_PROVIDER_SITE_OTHER): Payer: BC Managed Care – PPO

## 2022-03-30 ENCOUNTER — Other Ambulatory Visit: Payer: Self-pay

## 2022-03-30 DIAGNOSIS — L281 Prurigo nodularis: Secondary | ICD-10-CM | POA: Diagnosis not present

## 2022-03-30 DIAGNOSIS — L308 Other specified dermatitis: Secondary | ICD-10-CM

## 2022-03-30 MED ORDER — DUPIXENT 300 MG/2ML ~~LOC~~ SOAJ: 300.0000 mg | SUBCUTANEOUS | 4 refills | Status: DC

## 2022-03-30 MED ORDER — DUPIXENT 300 MG/2ML ~~LOC~~ SOSY: 300.0000 mg | PREFILLED_SYRINGE | SUBCUTANEOUS | 0 refills | Status: DC

## 2022-03-30 NOTE — Progress Notes (Signed)
[facility] faxed requesting pharmacy change to [facility]

## 2022-03-30 NOTE — Progress Notes (Signed)
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    Dupixent '300mg'$ /28m pen injected into right upper arm. Patient tolerated well.   LOT:: [id number]: 05/2024 NDC:: 5953-9672-89 Patient advised RX [facility] Patient would like to continue to come here once he starts receiving his own medication.    AJohnsie Kindred RMA

## 2022-04-13 ENCOUNTER — Ambulatory Visit (INDEPENDENT_AMBULATORY_CARE_PROVIDER_SITE_OTHER): Payer: BC Managed Care – PPO

## 2022-04-13 DIAGNOSIS — L281 Prurigo nodularis: Secondary | ICD-10-CM | POA: Diagnosis not present

## 2022-04-13 MED ORDER — DUPIXENT 300 MG/2ML ~~LOC~~ SOSY: 300.0000 mg | PREFILLED_SYRINGE | SUBCUTANEOUS | 0 refills | Status: DC

## 2022-04-13 NOTE — Progress Notes (Signed)
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    Dupixent 315m/2mL pen injected into left upper arm. Patient tolerated well.   LOT: [id number]: 05/2024 NDC: [id number] AJohnsie Kindred RMA

## 2022-04-16 ENCOUNTER — Other Ambulatory Visit: Payer: Self-pay | Admitting: Internal Medicine

## 2022-04-16 DIAGNOSIS — K7030 Alcoholic cirrhosis of liver without ascites: Secondary | ICD-10-CM

## 2022-04-27 ENCOUNTER — Ambulatory Visit (INDEPENDENT_AMBULATORY_CARE_PROVIDER_SITE_OTHER): Payer: BC Managed Care – PPO

## 2022-04-27 DIAGNOSIS — L281 Prurigo nodularis: Secondary | ICD-10-CM

## 2022-04-27 DIAGNOSIS — L209 Atopic dermatitis, unspecified: Secondary | ICD-10-CM

## 2022-04-27 MED ORDER — DUPILUMAB 300 MG/2ML ~~LOC~~ SOSY
300.0000 mg | PREFILLED_SYRINGE | Freq: Once | SUBCUTANEOUS | Status: AC
  Administered 2022-04-27: 300 mg via SUBCUTANEOUS

## 2022-04-27 NOTE — Progress Notes (Signed)
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    Dupixent '300mg'$ /58m pen injected into right upper arm. Patient tolerated well.   LOT: [id number]: 05/2024 NDC: [id number]  AJohnsie Kindred RMA

## 2022-05-13 ENCOUNTER — Ambulatory Visit: Payer: BC Managed Care – PPO | Admitting: Dermatology

## 2022-05-13 ENCOUNTER — Encounter: Payer: Self-pay | Admitting: Dermatology

## 2022-05-13 VITALS — BP 123/73 | HR 67

## 2022-05-13 DIAGNOSIS — L281 Prurigo nodularis: Secondary | ICD-10-CM | POA: Diagnosis not present

## 2022-05-13 DIAGNOSIS — L2081 Atopic neurodermatitis: Secondary | ICD-10-CM | POA: Diagnosis not present

## 2022-05-13 DIAGNOSIS — Z79899 Other long term (current) drug therapy: Secondary | ICD-10-CM | POA: Diagnosis not present

## 2022-05-13 MED ORDER — DUPILUMAB 300 MG/2ML ~~LOC~~ SOSY
300.0000 mg | PREFILLED_SYRINGE | Freq: Once | SUBCUTANEOUS | Status: AC
  Administered 2022-05-13: 300 mg via SUBCUTANEOUS

## 2022-05-13 MED ORDER — TRIAMCINOLONE ACETONIDE 0.1 % EX OINT: TOPICAL_OINTMENT | CUTANEOUS | 0 refills | Status: DC

## 2022-05-13 NOTE — Patient Instructions (Addendum)
Topical steroids (such as triamcinolone, fluocinolone, fluocinonide, mometasone, clobetasol, halobetasol, betamethasone, hydrocortisone) can cause thinning and lightening of the skin if they are used for too long in the same area. Your physician has selected the right strength medicine for your problem and area affected on the body. Please use your medication only as directed by your physician to prevent side effects.    Due to recent changes in healthcare laws, you may see results of your pathology and/or laboratory studies on MyChart before the doctors have had a chance to review them. We understand that in some cases there may be results that are confusing or concerning to you. Please understand that not all results are received at the same time and often the doctors may need to interpret multiple results in order to provide you with the best plan of care or course of treatment. Therefore, we ask that you please give us 2 business days to thoroughly review all your results before contacting the office for clarification. Should we see a critical lab result, you will be contacted sooner.   If You Need Anything After Your Visit  If you have any questions or concerns for your doctor, please call our main line at [phone] and press option 4 to reach your doctor's medical assistant. If no one answers, please leave a voicemail as directed and we will return your call as soon as possible. Messages left after 4 pm will be answered the following business day.   You may also send us a message via MyChart. We typically respond to MyChart messages within 1-2 business days.  For prescription refills, please ask your pharmacy to contact our office. Our fax number is [phone].  If you have an urgent issue when the clinic is closed that cannot wait until the next business day, you can page your doctor at the number below.    Please note that while we do our best to be available for urgent issues outside of  office hours, we are not available 24/7.   If you have an urgent issue and are unable to reach us, you may choose to seek medical care at your doctor's office, retail clinic, urgent care center, or emergency room.  If you have a medical emergency, please immediately call 911 or go to the emergency department.  Pager Numbers  - Dr. Kowalski: [phone]  - Dr. Moye: [phone]  - Dr. Stewart: [phone]  In the event of inclement weather, please call our main line at [phone] for an update on the status of any delays or closures.  Dermatology Medication Tips: Please keep the boxes that topical medications come in in order to help keep track of the instructions about where and how to use these. Pharmacies typically print the medication instructions only on the boxes and not directly on the medication tubes.   If your medication is too expensive, please contact our office at [phone] option 4 or send us a message through MyChart.   We are unable to tell what your co-pay for medications will be in advance as this is different depending on your insurance coverage. However, we may be able to find a substitute medication at lower cost or fill out paperwork to get insurance to cover a needed medication.   If a prior authorization is required to get your medication covered by your insurance company, please allow us 1-2 business days to complete this process.  Drug prices often vary depending on where the prescription is filled and some pharmacies   may offer cheaper prices.  The website www.goodrx.com contains coupons for medications through different pharmacies. The prices here do not account for what the cost may be with help from insurance (it may be cheaper with your insurance), but the website can give you the price if you did not use any insurance.  - You can print the associated coupon and take it with your prescription to the pharmacy.  - You may also stop by our office during  regular business hours and pick up a GoodRx coupon card.  - If you need your prescription sent electronically to a different pharmacy, notify our office through [place] MyChart or by phone at [phone] option 4.     Si Usted Necesita Algo Despus de Su Visita  Tambin puede enviarnos un mensaje a travs de MyChart. Por lo general respondemos a los mensajes de MyChart en el transcurso de 1 a 2 das hbiles.  Para renovar recetas, por favor pida a su farmacia que se ponga en contacto con nuestra oficina. Nuestro nmero de fax es el [phone].  Si tiene un asunto urgente cuando la clnica est cerrada y que no puede esperar hasta el siguiente da hbil, puede llamar/localizar a su doctor(a) al nmero que aparece a continuacin.   Por favor, tenga en cuenta que aunque hacemos todo lo posible para estar disponibles para asuntos urgentes fuera del horario de oficina, no estamos disponibles las 24 horas del da, los 7 das de la semana.   Si tiene un problema urgente y no puede comunicarse con nosotros, puede optar por buscar atencin mdica  en el consultorio de su doctor(a), en una clnica privada, en un centro de atencin urgente o en una sala de emergencias.  Si tiene una emergencia mdica, por favor llame inmediatamente al 911 o vaya a la sala de emergencias.  Nmeros de bper  - Dr. Kowalski: [phone]  - Dra. Moye: [phone]  - Dra. Stewart: [phone]  En caso de inclemencias del tiempo, por favor llame a nuestra lnea principal al [phone] para una actualizacin sobre el estado de cualquier retraso o cierre.  Consejos para la medicacin en dermatologa: Por favor, guarde las cajas en las que vienen los medicamentos de uso tpico para ayudarle a seguir las instrucciones sobre dnde y cmo usarlos. Las farmacias generalmente imprimen las instrucciones del medicamento slo en las cajas y no directamente en los tubos del medicamento.   Si su medicamento es muy  caro, por favor, pngase en contacto con nuestra oficina llamando al [phone] y presione la opcin 4 o envenos un mensaje a travs de MyChart.   No podemos decirle cul ser su copago por los medicamentos por adelantado ya que esto es diferente dependiendo de la cobertura de su seguro. Sin embargo, es posible que podamos encontrar un medicamento sustituto a menor costo o llenar un formulario para que el seguro cubra el medicamento que se considera necesario.   Si se requiere una autorizacin previa para que su compaa de seguros cubra su medicamento, por favor permtanos de 1 a 2 das hbiles para completar este proceso.  Los precios de los medicamentos varan con frecuencia dependiendo del lugar de dnde se surte la receta y alguna farmacias pueden ofrecer precios ms baratos.  El sitio web www.goodrx.com tiene cupones para medicamentos de diferentes farmacias. Los precios aqu no tienen en cuenta lo que podra costar con la ayuda del seguro (puede ser ms barato con su seguro), pero el sitio web puede darle el precio si no   utiliz ningn seguro.  - Puede imprimir el cupn correspondiente y llevarlo con su receta a la farmacia.  - Tambin puede pasar por nuestra oficina durante el horario de atencin regular y recoger una tarjeta de cupones de GoodRx.  - Si necesita que su receta se enve electrnicamente a una farmacia diferente, informe a nuestra oficina a travs de MyChart de [place] o por telfono llamando al [phone] y presione la opcin 4.  

## 2022-05-13 NOTE — Progress Notes (Addendum)
   Follow-Up Visit   Subjective  Ronnie Cohen is a 57 y.o. 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 2 months f/u on dermatitis on his body, taking Dupixent injections every 2 weeks and using Opzelura cream occasionally with a fair response, itching is much better, patient still picking at his skin.   Patient has a hx liver cancer.   The following portions of the chart were reviewed this encounter and updated as appropriate: medications, allergies, medical history  Review of Systems:  No other skin or systemic complaints except as noted in HPI or Assessment and Plan.  Objective  Well appearing patient in no apparent distress; mood and affect are within normal limits.  Areas Examined: Face,arms,hands     Assessment & Plan    ATOPIC DERMATITIS Exam: Scaly pink papules coalescing to plaques  Chronic and persistent condition with duration or expected duration over one year. Condition is symptomatic/ bothersome to patient. Not currently at goal. Improvement on dupixent but not yet at goal  Treatment Plan Continue Dupixent 300 mg/9mL SQ QOW. Patient denies side effects.  Dupixent 300mg /42mL injected SQ into the left upper arm. Patient tolerated injection well.  [id number] Lot: [id number] Exp: 05/2024  Potential side effects include allergic reaction, herpes infections, injection site reactions and conjunctivitis (inflammation of the eyes).  The use of Dupixent requires long term medication management, including periodic office visits.  Consider rinvoq or cibinqo short term if not clearing. Pt with history of liver cancer.  Atopic dermatitis (eczema) is a chronic, relapsing, pruritic condition that can significantly affect quality of life. It is often associated with allergic rhinitis and/or asthma and can require treatment with topical medications, phototherapy, or in severe cases biologic injectable medication  (Dupixent; Adbry) or Oral JAK inhibitors.  Recommend gentle skin care.  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 Condition is symptomatic/ bothersome to patient. Not currently at goal.  Increase opzelura to at least once a day, up to twice a day At night, apply opzelura followed by triamcinolone ointment and a bandaid for up to 3 weeks. Monitor for thinning or lightening of the skin.  Topical steroids (such as triamcinolone, fluocinolone, fluocinonide, mometasone, clobetasol, halobetasol, betamethasone, hydrocortisone) can cause thinning and lightening of the skin if they are used for too long in the same area. Your physician has selected the right strength medicine for your problem and area affected on the body. Please use your medication only as directed by your physician to prevent side effects.     Return in about 2 weeks (around 05/27/2022) for nurse Dupixent injection and 4 weeks Dr Laurence Ferrari .  I, Marye Round, CMA, am acting as scribe for Forest Gleason, MD .   Documentation: I have reviewed the above documentation for accuracy and completeness, and I agree with the above.  Forest Gleason, MD

## 2022-05-27 ENCOUNTER — Ambulatory Visit (INDEPENDENT_AMBULATORY_CARE_PROVIDER_SITE_OTHER): Payer: BC Managed Care – PPO

## 2022-05-27 DIAGNOSIS — L209 Atopic dermatitis, unspecified: Secondary | ICD-10-CM

## 2022-05-27 MED ORDER — DUPILUMAB 300 MG/2ML ~~LOC~~ SOSY
300.0000 mg | PREFILLED_SYRINGE | Freq: Once | SUBCUTANEOUS | Status: AC
Start: 2022-05-27 — End: 2022-05-27
  Administered 2022-05-27: 300 mg via SUBCUTANEOUS

## 2022-05-27 NOTE — Progress Notes (Signed)
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    Dupixent 300mg /58mL pen injected into right upper arm. Patient tolerated well.   LOT: [id number] EXP: 11/2022 NDCEX:[id number]   Jamesmichael Shadd , RMA

## 2022-06-10 ENCOUNTER — Ambulatory Visit: Payer: BC Managed Care – PPO | Admitting: Dermatology

## 2022-07-14 ENCOUNTER — Ambulatory Visit: Payer: BC Managed Care – PPO | Admitting: Dermatology

## 2022-07-14 ENCOUNTER — Telehealth: Payer: Self-pay | Admitting: Dermatology

## 2022-07-14 ENCOUNTER — Telehealth: Payer: Self-pay

## 2022-07-14 VITALS — BP 141/78 | HR 76

## 2022-07-14 DIAGNOSIS — Z7189 Other specified counseling: Secondary | ICD-10-CM

## 2022-07-14 DIAGNOSIS — L281 Prurigo nodularis: Secondary | ICD-10-CM

## 2022-07-14 DIAGNOSIS — Z79899 Other long term (current) drug therapy: Secondary | ICD-10-CM

## 2022-07-14 MED ORDER — DUPILUMAB 300 MG/2ML ~~LOC~~ SOPN
300.0000 mg | PEN_INJECTOR | Freq: Once | SUBCUTANEOUS | Status: AC
Start: 2022-07-14 — End: 2022-07-14
  Administered 2022-07-14: 300 mg via SUBCUTANEOUS

## 2022-07-14 MED ORDER — DUPIXENT 300 MG/2ML ~~LOC~~ SOAJ: 300.0000 mg | SUBCUTANEOUS | 5 refills | Status: AC

## 2022-07-14 NOTE — Progress Notes (Signed)
   Follow-Up Visit   Subjective  Ronnie Cohen is a 57 y.o. male who presents for the following: prurigo nodularis - of the B/L arms and hands. Patient hasn't had a Dupixent injection in about 6 weeks. Pt still picking at sores. He is also using Opzelura cream QD-BID PRN and TMC ointment. He hasn't noticed an improvement when prurigo nodules were treated with LN2.   The following portions of the chart were reviewed this encounter and updated as appropriate: medications, allergies, medical history  Review of Systems:  No other skin or systemic complaints except as noted in HPI or Assessment and Plan.  Objective  Well appearing patient in no apparent distress; mood and affect are within normal limits.  A focused examination was performed of the following areas: The face, arms, and hands  Relevant exam findings are noted in the Assessment and Plan.    Assessment & Plan   PRURIGO NODULARIS Exam: Excoriated papules and nodules at the B/L arms and hands  Chronic and persistent condition with duration or expected duration over one year. Condition is bothersome/symptomatic for patient. Currently flared.  Treatment Plan: D/C Opzelura since insurance will not cover both Opzelura and Dupixent.   Continue Dupixent 300mg /69mL SQ Q2W. Instructed patient on how to inject Dupixent 300mg /27mL injected SQ into the R ant thigh. Pt did well and had no further questions or concerns about self injecting. Dupilumab (Dupixent) is a treatment given by injection for adults and children with moderate-to-severe atopic dermatitis. Goal is control of skin condition, not cure. It is given as 2 injections at the first dose followed by 1 injection ever 2 weeks thereafter.  Young children are dosed monthly.  Potential side effects include allergic reaction, herpes infections, injection site reactions and conjunctivitis (inflammation of the eyes).  The use of Dupixent requires long term medication management,  including periodic office visits.  Recommend cognitive behavioral therapy, patient does have a therapist and will bring this up to him/her.    Recommend other options like meditation. Recommend Headspace app.   Recommend discussing with therapist and PCP whether medication could be adjusted to help any more with anxiety.  Recommend N-acetylcysteine (NAC) 600 mg supplement three times per day to help with picking  Return in about 3 months (around 10/14/2022) for prurigo nodularis follow up .  Maylene Roes, CMA, am acting as scribe for Darden Dates, MD .   Documentation: I have reviewed the above documentation for accuracy and completeness, and I agree with the above.  Darden Dates, MD

## 2022-07-14 NOTE — Telephone Encounter (Signed)
LVM for patient to call office concerning medication, JS

## 2022-07-14 NOTE — Telephone Encounter (Signed)
Please call Ronnie Cohen and ask who his practitioner is who prescribes his Sertraline/Zoloft? I cannot see visits in our system to get their name and contact info. Thank you!

## 2022-07-14 NOTE — Telephone Encounter (Signed)
Left message on machine to return my call. Pt to D/C Opzelura since we are continue Dupixent therapy. If he has at home and uses it, it will not be harmful though.

## 2022-07-14 NOTE — Patient Instructions (Addendum)
Recommend N-acetylcysteine (NAC) 600 mg supplement three to four times per day to help with picking  Recommend cognitive behavioral therapy and other outlets like mediation (Headspace app).  [employer] Pod[employer] Episode 197 Urges and Episode 263 The Urge Jar    Due to recent changes in healthcare laws, you may see results of your pathology and/or laboratory studies on MyChart before the doctors have had a chance to review them. We understand that in some cases there may be results that are confusing or concerning to you. Please understand that not all results are received at the same time and often the doctors may need to interpret multiple results in order to provide you with the best plan of care or course of treatment. Therefore, we ask that you please give [place] 2 business days to thoroughly review all your results before contacting the office for clarification. Should we see a critical lab result, you will be contacted sooner.   If You Need Anything After Your Visit  If you have any questions or concerns for your doctor, please call our main line at [phone] and press option 4 to reach your doctor's medical assistant. If no one answers, please leave a voicemail as directed and we will return your call as soon as possible. Messages left after 4 pm will be answered the following business day.   You may also send [place] a message via MyChart. We typically respond to MyChart messages within 1-2 business days.  For prescription refills, please ask your pharmacy to contact our office. Our fax number is [phone].  If you have an urgent issue when the clinic is closed that cannot wait until the next business day, you can page your doctor at the number below.    Please note that while we do our best to be available for urgent issues outside of office hours, we are not available 24/7.   If you have an urgent issue and are unable to reach [place], you may choose to seek medical care at your  doctor's office, retail clinic, urgent care center, or emergency room.  If you have a medical emergency, please immediately call 911 or go to the emergency department.  Pager Numbers  - Dr. Gwen Pounds: [phone]  - Dr. Neale Burly: [phone]  - Dr. Roseanne Reno: [phone]  In the event of inclement weather, please call our main line at [phone] for an update on the status of any delays or closures.  Dermatology Medication Tips: Please keep the boxes that topical medications come in in order to help keep track of the instructions about where and how to use these. Pharmacies typically print the medication instructions only on the boxes and not directly on the medication tubes.   If your medication is too expensive, please contact our office at [phone] option 4 or send [place] a message through MyChart.   We are unable to tell what your co-pay for medications will be in advance as this is different depending on your insurance coverage. However, we may be able to find a substitute medication at lower cost or fill out paperwork to get insurance to cover a needed medication.   If a prior authorization is required to get your medication covered by your insurance company, please allow [place] 1-2 business days to complete this process.  Drug prices often vary depending on where the prescription is filled and some pharmacies may offer cheaper prices.  The website www.goodrx.com contains coupons for medications through different pharmacies. The prices here do  not account for what the cost may be with help from insurance (it may be cheaper with your insurance), but the website can give you the price if you did not use any insurance.  - You can print the associated coupon and take it with your prescription to the pharmacy.  - You may also stop by our office during regular business hours and pick up a GoodRx coupon card.  - If you need your prescription sent electronically to a different pharmacy, notify  our office through [facility] or by phone at [phone] option 4.     Si Usted Necesita Algo Despus de Su Visita  Tambin puede enviarnos un mensaje a travs de Clinical cytogeneticist. Por lo general respondemos a los mensajes de MyChart en el transcurso de 1 a 2 das hbiles.  Para renovar recetas, por favor pida a su farmacia que se ponga en contacto con nuestra oficina. Annie Sable de fax es [place] [phone].  Si tiene un asunto urgente cuando la clnica est cerrada y que no puede esperar hasta el siguiente da hbil, puede llamar/localizar a su doctor(a) al nmero que aparece a continuacin.   Por favor, tenga en cuenta que aunque hacemos todo lo posible para estar disponibles para asuntos urgentes fuera del horario de [place], no estamos disponibles las 24 horas del da, los 7 [address]  [address] de la [place].   Si tiene un problema urgente y no puede comunicarse con nosotros, puede optar por buscar atencin mdica  en el consultorio de su doctor(a), en una clnica privada, en un centro de atencin urgente o en una sala de emergencias.  Si tiene Engineer, drilling, por favor llame inmediatamente al 911 o vaya a la sala de emergencias.  Nmeros de bper  - Dr. Gwen Pounds: [phone]  - Dra. Moye: [phone]  - Dra. Roseanne Reno: [phone]  En caso de inclemencias del [place], por favor llame a Lacy Duverney principal al [phone] para una actualizacin sobre el [place] de cualquier retraso o cierre.  Consejos para la medicacin en dermatologa: Por favor, guarde las cajas en las que vienen los medicamentos de uso tpico para ayudarle a seguir las instrucciones sobre dnde y cmo usarlos. Las farmacias generalmente imprimen las instrucciones del medicamento slo en las cajas y no directamente en los tubos del [place].   Si su medicamento es muy caro, por favor, pngase en contacto con Rolm Gala llamando al [phone] y presione la opcin 4 o envenos un mensaje a travs de  Clinical cytogeneticist.   No podemos decirle cul ser su copago por los medicamentos por adelantado ya que esto es diferente dependiendo de la cobertura de su seguro. Sin embargo, es posible que podamos encontrar un medicamento sustituto a Audiological scientist un formulario para que el seguro cubra el medicamento que se considera necesario.   Si se requiere una autorizacin previa para que su compaa de seguros [place] su medicamento, por favor permtanos de 1 a 2 das hbiles para completar [address]  Los precios de los medicamentos varan con frecuencia dependiendo del Environmental consultant de dnde se surte la receta y alguna farmacias pueden ofrecer precios ms baratos.  El sitio web www.goodrx.com tiene cupones para medicamentos de Health and safety inspector. Los precios aqu no tienen en cuenta lo que podra costar con la ayuda del seguro (puede ser ms barato con su seguro), pero el sitio web puede darle el precio si no utiliz Tourist information centre manager.  - Puede imprimir el cupn correspondiente y llevarlo con su receta a la farmacia.  -  Tambin puede pasar por nuestra oficina durante el horario de atencin regular y Education officer, museum una tarjeta de cupones de GoodRx.  - Si necesita que su receta se enve electrnicamente a una farmacia diferente, informe a nuestra oficina a travs de MyChart de [place] o por telfono llamando al [phone] y presione la opcin 4.

## 2022-07-28 ENCOUNTER — Ambulatory Visit (INDEPENDENT_AMBULATORY_CARE_PROVIDER_SITE_OTHER): Payer: BC Managed Care – PPO

## 2022-07-28 DIAGNOSIS — L281 Prurigo nodularis: Secondary | ICD-10-CM

## 2022-07-28 MED ORDER — DUPILUMAB 300 MG/2ML ~~LOC~~ SOPN
300.0000 mg | PEN_INJECTOR | Freq: Once | SUBCUTANEOUS | Status: AC
Start: 2022-07-28 — End: 2022-07-28
  Administered 2022-07-28: 300 mg via SUBCUTANEOUS

## 2022-07-28 NOTE — Progress Notes (Signed)
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    Dupixent 300mg /25mL pen injected into right thigh. Patient tolerated well.   LOT: [id number] EXP: 05/22/2024 NDC: 9147-8295-62   Dorathy Daft, RMA

## 2022-08-02 ENCOUNTER — Other Ambulatory Visit: Payer: Self-pay | Admitting: Dermatology

## 2022-08-11 ENCOUNTER — Ambulatory Visit (INDEPENDENT_AMBULATORY_CARE_PROVIDER_SITE_OTHER): Payer: BC Managed Care – PPO

## 2022-08-11 DIAGNOSIS — L209 Atopic dermatitis, unspecified: Secondary | ICD-10-CM

## 2022-08-11 MED ORDER — DUPILUMAB 300 MG/2ML ~~LOC~~ SOSY
300.0000 mg | PREFILLED_SYRINGE | Freq: Once | SUBCUTANEOUS | Status: AC
Start: 2022-08-11 — End: 2022-08-11
  Administered 2022-08-11: 300 mg via SUBCUTANEOUS

## 2022-08-11 NOTE — Progress Notes (Signed)
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    Dupixent 300mg /67mL pen injected into right arm. Patient tolerated well.   LOT: [id number] EXP: 05/2024 NDC: 0454-0981-19   Dorathy Daft, RMA

## 2022-08-25 ENCOUNTER — Ambulatory Visit (INDEPENDENT_AMBULATORY_CARE_PROVIDER_SITE_OTHER): Payer: BC Managed Care – PPO

## 2022-08-25 DIAGNOSIS — L209 Atopic dermatitis, unspecified: Secondary | ICD-10-CM

## 2022-08-25 MED ORDER — DUPILUMAB 300 MG/2ML ~~LOC~~ SOSY
300.0000 mg | PREFILLED_SYRINGE | Freq: Once | SUBCUTANEOUS | Status: AC
Start: 2022-08-25 — End: 2022-08-25
  Administered 2022-08-25: 300 mg via SUBCUTANEOUS

## 2022-08-25 NOTE — Progress Notes (Signed)
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    Dupixent 300mg /70mL pen injected into left arm. Patient tolerated well.   LOT: [id number] EXP: 05/2024 NDC: 0454-0981-19   Dorathy Daft, RMA

## 2022-09-08 ENCOUNTER — Ambulatory Visit: Payer: BC Managed Care – PPO

## 2022-09-08 ENCOUNTER — Telehealth: Payer: Self-pay

## 2022-09-08 NOTE — Telephone Encounter (Signed)
Patient called stating he missed his appt today,  LM on VM please call here to reschedule his appt with a nurse for his Dupixent injection

## 2022-11-22 ENCOUNTER — Ambulatory Visit: Payer: BC Managed Care – PPO | Admitting: Dermatology

## 2022-11-22 VITALS — BP 169/86 | HR 79 | Wt 208.0 lb

## 2022-11-22 DIAGNOSIS — L281 Prurigo nodularis: Secondary | ICD-10-CM | POA: Diagnosis not present

## 2022-11-22 DIAGNOSIS — L280 Lichen simplex chronicus: Secondary | ICD-10-CM | POA: Diagnosis not present

## 2022-11-22 MED ORDER — NEMLUVIO 30 MG ~~LOC~~ AUIJ: 2.0000 | AUTO-INJECTOR | SUBCUTANEOUS | 2 refills | Status: AC

## 2022-11-22 MED ORDER — CLOBETASOL PROPIONATE 0.05 % EX OINT: 1.0000 | TOPICAL_OINTMENT | Freq: Two times a day (BID) | CUTANEOUS | 0 refills | Status: AC

## 2022-11-22 MED ORDER — NEMLUVIO 30 MG ~~LOC~~ AUIJ: 2.0000 | AUTO-INJECTOR | Freq: Once | SUBCUTANEOUS | 0 refills | Status: AC

## 2022-11-22 NOTE — Progress Notes (Signed)
   Follow Up Visit   Subjective  Ronnie Cohen is a 57 y.o. male who presents for the following: 4 months f/u on a itchy rash on his arms, dx prurigo nodularis, using otc Calamine lotion  with a poor response, tried and failed Dupixent injection, last injection was 7/2/204, has also tried Clobetasol cream, Triamcinolone cream and Tacrolimus ointment without improvement.  Gets very itchy and he scratches/picks due to intense itch.    The following portions of the chart were reviewed this encounter and updated as appropriate: medications, allergies, medical history  Review of Systems:  No other skin or systemic complaints except as noted in HPI or Assessment and Plan.  Objective  Well appearing patient in no apparent distress; mood and affect are within normal limits.  A focused examination was performed of the following areas:bilateral arms    Relevant exam findings are noted in the Assessment and Plan.    Assessment & Plan  Lichen simplex chronicus  Related Medications tacrolimus (PROTOPIC) 0.1 % ointment APPLY DAILY AND COVER WITH BAND AID.  clobetasol ointment (TEMOVATE) 0.05 % Apply 1 Application topically 2 (two) times daily. Cover with bandaid    PRURIGO NODULARIS Exam: multiple firm scaly excoriated nodules on the BL arms and hands >20  Chronic and persistent condition with duration or expected duration over one year. Condition is bothersome/symptomatic for patient. Currently flared.  Has tried and failed topical steroids/tacrolimus, and Dupixent injections.  Treatment Plan: Pending approval start Nemluvio sq injections q month, prescription sent in on [facility] portal, and paperwork faxed to [facility]   Restart Clobetasol ointment spot treat on bumps once or twice a day   Topical steroids (such as triamcinolone, fluocinolone, fluocinonide, mometasone, clobetasol, halobetasol, betamethasone, hydrocortisone) can cause thinning and lightening of the skin if they  are used for too long in the same area. Your physician has selected the right strength medicine for your problem and area affected on the body. Please use your medication only as directed by your physician to prevent side effects.    Recommend OTC Gold Bond Rapid Relief Anti-Itch cream (pramoxine + menthol), CeraVe Anti-itch cream or lotion (pramoxine), Sarna lotion (Original- menthol + camphor or Sensitive- pramoxine) or Eucerin 12 hour Itch Relief lotion (menthol) up to 3 times per day to areas on body that are itchy.   Avoid picking and scratching arms, try cold pack or slapping skin instead   Return in about 4 weeks (around 12/20/2022) for Prurigo nodularis .  I, Angelique Holm, CMA, am acting as scribe for Willeen Niece, MD .   Documentation: I have reviewed the above documentation for accuracy and completeness, and I agree with the above.  Willeen Niece, MD

## 2022-11-22 NOTE — Patient Instructions (Addendum)
Recommend OTC Gold Bond Rapid Relief Anti-Itch cream (pramoxine + menthol), CeraVe Anti-itch cream or lotion (pramoxine), Sarna lotion (Original- menthol + camphor or Sensitive- pramoxine) or Eucerin 12 hour Itch Relief lotion (menthol) up to 3 times per day to areas on body that are itchy.     Due to recent changes in healthcare laws, you may see results of your pathology and/or laboratory studies on MyChart before the doctors have had a chance to review them. We understand that in some cases there may be results that are confusing or concerning to you. Please understand that not all results are received at the same time and often the doctors may need to interpret multiple results in order to provide you with the best plan of care or course of treatment. Therefore, we ask that you please give [place] 2 business days to thoroughly review all your results before contacting the office for clarification. Should we see a critical lab result, you will be contacted sooner.   If You Need Anything After Your Visit  If you have any questions or concerns for your doctor, please call our main line at [phone] and press option 4 to reach your doctor's medical assistant. If no one answers, please leave a voicemail as directed and we will return your call as soon as possible. Messages left after 4 pm will be answered the following business day.   You may also send [place] a message via MyChart. We typically respond to MyChart messages within 1-2 business days.  For prescription refills, please ask your pharmacy to contact our office. Our fax number is [phone].  If you have an urgent issue when the clinic is closed that cannot wait until the next business day, you can page your doctor at the number below.    Please note that while we do our best to be available for urgent issues outside of office hours, we are not available 24/7.   If you have an urgent issue and are unable to reach [place], you may choose to seek  medical care at your doctor's office, retail clinic, urgent care center, or emergency room.  If you have a medical emergency, please immediately call 911 or go to the emergency department.  Pager Numbers  - Dr. Gwen Pounds: [phone]  - Dr. Roseanne Reno: [phone]  - Dr. Katrinka Blazing: [phone]   In the event of inclement weather, please call our main line at [phone] for an update on the status of any delays or closures.  Dermatology Medication Tips: Please keep the boxes that topical medications come in in order to help keep track of the instructions about where and how to use these. Pharmacies typically print the medication instructions only on the boxes and not directly on the medication tubes.   If your medication is too expensive, please contact our office at [phone] option 4 or send [place] a message through MyChart.   We are unable to tell what your co-pay for medications will be in advance as this is different depending on your insurance coverage. However, we may be able to find a substitute medication at lower cost or fill out paperwork to get insurance to cover a needed medication.   If a prior authorization is required to get your medication covered by your insurance company, please allow [place] 1-2 business days to complete this process.  Drug prices often vary depending on where the prescription is filled and some pharmacies may offer cheaper prices.  The website www.goodrx.com contains coupons for medications through  different pharmacies. The prices here do not account for what the cost may be with help from insurance (it may be cheaper with your insurance), but the website can give you the price if you did not use any insurance.  - You can print the associated coupon and take it with your prescription to the pharmacy.  - You may also stop by our office during regular business hours and pick up a GoodRx coupon card.  - If you need your prescription sent electronically to a  different pharmacy, notify our office through [facility] or by phone at [phone] option 4.     Si Usted Necesita Algo Despus de Su Visita  Tambin puede enviarnos un mensaje a travs de Clinical cytogeneticist. Por lo general respondemos a los mensajes de MyChart en el transcurso de 1 a 2 das hbiles.  Para renovar recetas, por favor pida a su farmacia que se ponga en contacto con nuestra oficina. Annie Sable de fax es [place] [phone].  Si tiene un asunto urgente cuando la clnica est cerrada y que no puede esperar hasta el siguiente da hbil, puede llamar/localizar a su doctor(a) al nmero que aparece a continuacin.   Por favor, tenga en cuenta que aunque hacemos todo lo posible para estar disponibles para asuntos urgentes fuera del horario de [place], no estamos disponibles las 24 horas del da, los 7 [address]  [address] de la [place].   Si tiene un problema urgente y no puede comunicarse con nosotros, puede optar por buscar atencin mdica  en el consultorio de su doctor(a), en una clnica privada, en un centro de atencin urgente o en una sala de emergencias.  Si tiene Engineer, drilling, por favor llame inmediatamente al 911 o vaya a la sala de emergencias.  Nmeros de bper  - Dr. Gwen Pounds: [phone]  - Dra. Roseanne Reno: [phone]  - Dr. Katrinka Blazing: [phone]   En caso de inclemencias del tiempo, por favor llame a Lacy Duverney principal al [phone] para una actualizacin sobre el [place] de cualquier retraso o cierre.  Consejos para la medicacin en dermatologa: Por favor, guarde las cajas en las que vienen los medicamentos de uso tpico para ayudarle a seguir las instrucciones sobre dnde y cmo usarlos. Las farmacias generalmente imprimen las instrucciones del medicamento slo en las cajas y no directamente en los tubos del [place].   Si su medicamento es muy caro, por favor, pngase en contacto con Rolm Gala llamando al [phone] y presione la opcin 4 o envenos un  mensaje a travs de Clinical cytogeneticist.   No podemos decirle cul ser su copago por los medicamentos por adelantado ya que esto es diferente dependiendo de la cobertura de su seguro. Sin embargo, es posible que podamos encontrar un medicamento sustituto a Audiological scientist un formulario para que el seguro cubra el medicamento que se considera necesario.   Si se requiere una autorizacin previa para que su compaa de seguros [place] su medicamento, por favor permtanos de 1 a 2 das hbiles para completar [address]  Los precios de los medicamentos varan con frecuencia dependiendo del Environmental consultant de dnde se surte la receta y alguna farmacias pueden ofrecer precios ms baratos.  El sitio web www.goodrx.com tiene cupones para medicamentos de Health and safety inspector. Los precios aqu no tienen en cuenta lo que podra costar con la ayuda del seguro (puede ser ms barato con su seguro), pero el sitio web puede darle el precio si no utiliz Tourist information centre manager.  - Puede imprimir el cupn correspondiente y llevarlo  con su receta a la farmacia.  - Tambin puede pasar por nuestra oficina durante el horario de atencin regular y Education officer, museum una tarjeta de cupones de GoodRx.  - Si necesita que su receta se enve electrnicamente a una farmacia diferente, informe a nuestra oficina a travs de MyChart de [place] o por telfono llamando al [phone] y presione la opcin 4.

## 2022-12-20 ENCOUNTER — Telehealth: Payer: Self-pay

## 2022-12-20 NOTE — Telephone Encounter (Signed)
Ronnie Cohen hub unable to reach patient. He does qualify for patient assistance with company.   Blank forms scanned into media for future reference. aw

## 2022-12-22 ENCOUNTER — Ambulatory Visit: Payer: BC Managed Care – PPO | Admitting: Dermatology

## 2022-12-29 ENCOUNTER — Ambulatory Visit: Payer: BC Managed Care – PPO | Admitting: Dermatology

## 2022-12-29 DIAGNOSIS — C4492 Squamous cell carcinoma of skin, unspecified: Secondary | ICD-10-CM

## 2022-12-29 DIAGNOSIS — D492 Neoplasm of unspecified behavior of bone, soft tissue, and skin: Secondary | ICD-10-CM | POA: Diagnosis not present

## 2022-12-29 DIAGNOSIS — L280 Lichen simplex chronicus: Secondary | ICD-10-CM

## 2022-12-29 DIAGNOSIS — C44622 Squamous cell carcinoma of skin of right upper limb, including shoulder: Secondary | ICD-10-CM | POA: Diagnosis not present

## 2022-12-29 DIAGNOSIS — R21 Rash and other nonspecific skin eruption: Secondary | ICD-10-CM | POA: Diagnosis not present

## 2022-12-29 DIAGNOSIS — B078 Other viral warts: Secondary | ICD-10-CM | POA: Diagnosis not present

## 2022-12-29 DIAGNOSIS — D485 Neoplasm of uncertain behavior of skin: Secondary | ICD-10-CM

## 2022-12-29 DIAGNOSIS — L281 Prurigo nodularis: Secondary | ICD-10-CM | POA: Diagnosis not present

## 2022-12-29 HISTORY — DX: Squamous cell carcinoma of skin, unspecified: C44.92

## 2022-12-29 MED ORDER — MUPIROCIN 2 % EX OINT: 1.0000 | TOPICAL_OINTMENT | Freq: Every day | CUTANEOUS | 2 refills | Status: AC

## 2022-12-29 NOTE — Patient Instructions (Addendum)

## 2022-12-29 NOTE — Progress Notes (Signed)
Follow-Up Visit   Subjective  Ronnie Cohen is a 57 y.o. male who presents for the following: Prurigo Nodularis of the arms and hands. He has used clobetasol ointment to these areas some, with some improvement. Patient does qualify for patient assistance with [employer], but they have been unable to get in contact with patient.    The following portions of the chart were reviewed this encounter and updated as appropriate: medications, allergies, medical history  Review of Systems:  No other skin or systemic complaints except as noted in HPI or Assessment and Plan.  Objective  Well appearing patient in no apparent distress; mood and affect are within normal limits.  A focused examination was performed of the following areas: Face, hands, arms  Relevant physical exam findings are noted in the Assessment and Plan.  Right Forearm Distal 1.0 cm excoriated nodule      Right Forearm Proximal 1.3 cm excoriated nodule       Assessment & Plan   Neoplasm of uncertain behavior of skin (2) Right Forearm Distal  Epidermal / dermal shaving  Lesion diameter (cm):  1 Informed consent: discussed and consent obtained   Patient was prepped and draped in usual sterile fashion: Area prepped with alcohol. Anesthesia: the lesion was anesthetized in a standard fashion   Anesthetic:  1% lidocaine w/ epinephrine 1-100,000 buffered w/ 8.4% NaHCO3 Instrument used: flexible razor blade   Hemostasis achieved with: pressure, aluminum chloride and electrodesiccation   Outcome: patient tolerated procedure well   Post-procedure details: wound care instructions given   Post-procedure details comment:  Ointment and small bandage applied  Specimen 1 - Surgical pathology Differential Diagnosis: Prurigo Nodule r/o SCC Check Margins: No  Right Forearm Proximal  Epidermal / dermal shaving  Lesion diameter (cm):  1.3 Informed consent: discussed and consent obtained   Patient was prepped and  draped in usual sterile fashion: Area prepped with alcohol. Anesthesia: the lesion was anesthetized in a standard fashion   Anesthetic:  1% lidocaine w/ epinephrine 1-100,000 buffered w/ 8.4% NaHCO3 Instrument used: flexible razor blade   Hemostasis achieved with: pressure, aluminum chloride and electrodesiccation   Outcome: patient tolerated procedure well   Post-procedure details: wound care instructions given   Post-procedure details comment:  Ointment and small bandage applied  Specimen 2 - Surgical pathology Differential Diagnosis: Prurigo Nodule r/o SCC Check Margins: No  Prurigo Nodularis r/o SCC  Rash  Related Medications mupirocin ointment (BACTROBAN) 2 % Apply 1 Application topically daily. Apply once daily to open areas and cover with band aid.   PRURIGO NODULARIS/LICHEN SIMPLEX CHRONICUS Exam: Excoriated papules and nodules at bilateral forearms and hands.  Chronic and persistent condition with duration or expected duration over one year. Condition is symptomatic/ bothersome to patient. Not currently at goal.   Lichen simplex chronicus [facility]) is a persistent itchy area of thickened skin that is induced by chronic rubbing and/or scratching (chronic dermatitis).  These areas may be pink, hyperpigmented and may have excoriations and bumps (prurigo nodules-PN).  PN/LSC is commonly observed in uncontrolled atopic dermatitis and other forms of eczema, and in other itchy skin conditions (eg, insect bites, scabies).  Sometimes it is not possible to know initial cause of PN/LSC if it has been present for a long time.  It generally responds well to treatment with high potency topical steroids.  It is important to stop rubbing/scratching the area in order to break the itch-scratch-rash-itch cycle, in order for the rash to resolve.   Treatment Plan:  Patient given number to call for [facility] patient assistance, as they have not been able to get in touch with patient. Will start, pending  approval. Continue clobetasol ointment every day/bid AA prn. Avoid applying to face, groin, and axilla. Use as directed. Long-term use can cause thinning of the skin.  Avoid picking/rubbing/scratching   Return in about 1 month (around 01/28/2023) for [employer] start.  ICherlyn Labella, CMA, am acting as scribe for Willeen Niece, MD .   Documentation: I have reviewed the above documentation for accuracy and completeness, and I agree with the above.  Willeen Niece, MD

## 2023-01-03 ENCOUNTER — Telehealth: Payer: Self-pay

## 2023-01-03 LAB — SURGICAL PATHOLOGY

## 2023-01-03 NOTE — Telephone Encounter (Signed)
-----   Message from Willeen Niece sent at 01/03/2023  5:56 PM EST ----- 1. Skin, right forearm distal :       VERRUCA VULGARIS, IRRITATED   2. Skin, right forearm proximal :       WELL DIFFERENTIATED SQUAMOUS CELL CARCINOMA   1. Irritated wart, benign, cryotherapy if it recurs 2. SCC skin cancer- needs EDC vrs excision   - please call patient

## 2023-01-03 NOTE — Telephone Encounter (Signed)
Left pt msg to call for bx results/sh 

## 2023-01-04 NOTE — Telephone Encounter (Signed)
Left msg for pt to call for bx results.

## 2023-01-10 ENCOUNTER — Telehealth: Payer: Self-pay

## 2023-01-10 NOTE — Telephone Encounter (Signed)
Left message on voicemail to return my call. Meredith with [facility] call stating they have been trying to contact patient to set him up with patient services, and if he could please return their call at [phone].

## 2023-01-10 NOTE — Progress Notes (Signed)
error 

## 2023-01-10 NOTE — Telephone Encounter (Signed)
Left pt msg to call for bx results/sh 

## 2023-01-17 ENCOUNTER — Telehealth: Payer: Self-pay

## 2023-01-17 NOTE — Telephone Encounter (Signed)
Patient returning our call. Discussed pathology results and appointment scheduled.

## 2023-01-17 NOTE — Telephone Encounter (Signed)
-----  Message from Willeen Niece sent at 01/03/2023  5:56 PM EST ----- 1. Skin, right forearm distal :       VERRUCA VULGARIS, IRRITATED   2. Skin, right forearm proximal :       WELL DIFFERENTIATED SQUAMOUS CELL CARCINOMA   1. Irritated wart, benign, cryotherapy if it recurs 2. SCC skin cancer- needs EDC vrs excision   - please call patient

## 2023-02-01 ENCOUNTER — Ambulatory Visit (INDEPENDENT_AMBULATORY_CARE_PROVIDER_SITE_OTHER): Payer: BC Managed Care – PPO | Admitting: Dermatology

## 2023-02-01 ENCOUNTER — Encounter: Payer: Self-pay | Admitting: Dermatology

## 2023-02-01 DIAGNOSIS — B079 Viral wart, unspecified: Secondary | ICD-10-CM

## 2023-02-01 DIAGNOSIS — L280 Lichen simplex chronicus: Secondary | ICD-10-CM | POA: Diagnosis not present

## 2023-02-01 DIAGNOSIS — C44622 Squamous cell carcinoma of skin of right upper limb, including shoulder: Secondary | ICD-10-CM | POA: Diagnosis not present

## 2023-02-01 DIAGNOSIS — L281 Prurigo nodularis: Secondary | ICD-10-CM | POA: Diagnosis not present

## 2023-02-01 DIAGNOSIS — C4492 Squamous cell carcinoma of skin, unspecified: Secondary | ICD-10-CM

## 2023-02-01 NOTE — Patient Instructions (Addendum)
Cryotherapy Aftercare  Wash gently with soap and water everyday.   Apply Mupirocin 2% Ointment and Band-Aid daily until healed.    Continue Clobetasol ointment to affected areas arms and hands once to twice daily until improved.  Topical steroids (such as triamcinolone, fluocinolone, fluocinonide, mometasone, clobetasol, halobetasol, betamethasone, hydrocortisone) can cause thinning and lightening of the skin if they are used for too long in the same area. Your physician has selected the right strength medicine for your problem and area affected on the body. Please use your medication only as directed by your physician to prevent side effects.    Due to recent changes in healthcare laws, you may see results of your pathology and/or laboratory studies on MyChart before the doctors have had a chance to review them. We understand that in some cases there may be results that are confusing or concerning to you. Please understand that not all results are received at the same time and often the doctors may need to interpret multiple results in order to provide you with the best plan of care or course of treatment. Therefore, we ask that you please give [place] 2 business days to thoroughly review all your results before contacting the office for clarification. Should we see a critical lab result, you will be contacted sooner.   If You Need Anything After Your Visit  If you have any questions or concerns for your doctor, please call our main line at [phone] and press option 4 to reach your doctor's medical assistant. If no one answers, please leave a voicemail as directed and we will return your call as soon as possible. Messages left after 4 pm will be answered the following business day.   You may also send [place] a message via MyChart. We typically respond to MyChart messages within 1-2 business days.  For prescription refills, please ask your pharmacy to contact our office. Our fax number is  [phone].  If you have an urgent issue when the clinic is closed that cannot wait until the next business day, you can page your doctor at the number below.    Please note that while we do our best to be available for urgent issues outside of office hours, we are not available 24/7.   If you have an urgent issue and are unable to reach [place], you may choose to seek medical care at your doctor's office, retail clinic, urgent care center, or emergency room.  If you have a medical emergency, please immediately call 911 or go to the emergency department.  Pager Numbers  - Dr. Gwen Pounds: [phone]  - Dr. Roseanne Reno: [phone]  - Dr. Katrinka Blazing: [phone]   In the event of inclement weather, please call our main line at [phone] for an update on the status of any delays or closures.  Dermatology Medication Tips: Please keep the boxes that topical medications come in in order to help keep track of the instructions about where and how to use these. Pharmacies typically print the medication instructions only on the boxes and not directly on the medication tubes.   If your medication is too expensive, please contact our office at [phone] option 4 or send [place] a message through MyChart.   We are unable to tell what your co-pay for medications will be in advance as this is different depending on your insurance coverage. However, we may be able to find a substitute medication at lower cost or fill out paperwork to get insurance to cover a needed medication.  If a prior authorization is required to get your medication covered by your insurance company, please allow [place] 1-2 business days to complete this process.  Drug prices often vary depending on where the prescription is filled and some pharmacies may offer cheaper prices.  The website www.goodrx.com contains coupons for medications through different pharmacies. The prices here do not account for what the cost may be with help from  insurance (it may be cheaper with your insurance), but the website can give you the price if you did not use any insurance.  - You can print the associated coupon and take it with your prescription to the pharmacy.  - You may also stop by our office during regular business hours and pick up a GoodRx coupon card.  - If you need your prescription sent electronically to a different pharmacy, notify our office through [facility] or by phone at [phone] option 4.     Si Usted Necesita Algo Despus de Su Visita  Tambin puede enviarnos un mensaje a travs de Clinical cytogeneticist. Por lo general respondemos a los mensajes de MyChart en el transcurso de 1 a 2 das hbiles.  Para renovar recetas, por favor pida a su farmacia que se ponga en contacto con nuestra oficina. Annie Sable de fax es [place] [phone].  Si tiene un asunto urgente cuando la clnica est cerrada y que no puede esperar hasta el siguiente da hbil, puede llamar/localizar a su doctor(a) al nmero que aparece a continuacin.   Por favor, tenga en cuenta que aunque hacemos todo lo posible para estar disponibles para asuntos urgentes fuera del horario de [place], no estamos disponibles las 24 horas del da, los 7 [address]  [address] de la [place].   Si tiene un problema urgente y no puede comunicarse con nosotros, puede optar por buscar atencin mdica  en el consultorio de su doctor(a), en una clnica privada, en un centro de atencin urgente o en una sala de emergencias.  Si tiene Engineer, drilling, por favor llame inmediatamente al 911 o vaya a la sala de emergencias.  Nmeros de bper  - Dr. Gwen Pounds: [phone]  - Dra. Roseanne Reno: [phone]  - Dr. Katrinka Blazing: [phone]   En caso de inclemencias del tiempo, por favor llame a Lacy Duverney principal al [phone] para una actualizacin sobre el [place] de cualquier retraso o cierre.  Consejos para la medicacin en dermatologa: Por favor, guarde las cajas en las que vienen los  medicamentos de uso tpico para ayudarle a seguir las instrucciones sobre dnde y cmo usarlos. Las farmacias generalmente imprimen las instrucciones del medicamento slo en las cajas y no directamente en los tubos del [place].   Si su medicamento es muy caro, por favor, pngase en contacto con Rolm Gala llamando al [phone] y presione la opcin 4 o envenos un mensaje a travs de Clinical cytogeneticist.   No podemos decirle cul ser su copago por los medicamentos por adelantado ya que esto es diferente dependiendo de la cobertura de su seguro. Sin embargo, es posible que podamos encontrar un medicamento sustituto a Audiological scientist un formulario para que el seguro cubra el medicamento que se considera necesario.   Si se requiere una autorizacin previa para que su compaa de seguros [place] su medicamento, por favor permtanos de 1 a 2 das hbiles para completar [address]  Los precios de los medicamentos varan con frecuencia dependiendo del Environmental consultant de dnde se surte la receta y alguna farmacias pueden ofrecer precios ms baratos.  El sitio web  www.goodrx.com tiene cupones para medicamentos de Health and safety inspector. Los precios aqu no tienen en cuenta lo que podra costar con la ayuda del seguro (puede ser ms barato con su seguro), pero el sitio web puede darle el precio si no utiliz Tourist information centre manager.  - Puede imprimir el cupn correspondiente y llevarlo con su receta a la farmacia.  - Tambin puede pasar por nuestra oficina durante el horario de atencin regular y Education officer, museum una tarjeta de cupones de GoodRx.  - Si necesita que su receta se enve electrnicamente a una farmacia diferente, informe a nuestra oficina a travs de MyChart de [place] o por telfono llamando al [phone] y presione la opcin 4.

## 2023-02-01 NOTE — Progress Notes (Signed)
Follow-Up Visit   Subjective  Ronnie Cohen is a 57 y.o. male who presents for the following: follow-up biopsies, proven irritated wart of the right forearm distal, and SCC of the right forearm proximal. Will discuss treatment today. He has clobetasol ointment to use, areas are improved some.   The patient has spots, moles and lesions to be evaluated, some may be new or changing.   Patient accompanied by Alma Downs.   The following portions of the chart were reviewed this encounter and updated as appropriate: medications, allergies, medical history  Review of Systems:  No other skin or systemic complaints except as noted in HPI or Assessment and Plan.  Objective  Well appearing patient in no apparent distress; mood and affect are within normal limits.  A focused examination was performed of the following areas: Face, arms  Relevant physical exam findings are noted in the Assessment and Plan.  Right Forearm Proximal Pink biopsy site.   Right Forearm Distal x 1 (bx proven), R forearm x 2, L forearm x 1 (4) Verrucous papules -- Discussed viral etiology and contagion.    Assessment & Plan   Squamous cell carcinoma of skin Right Forearm Proximal  Destruction of lesion  Destruction method: electrodesiccation and curettage   Informed consent: discussed and consent obtained   Timeout:  patient name, date of birth, surgical site, and procedure verified Anesthesia: the lesion was anesthetized in a standard fashion   Anesthetic:  1% lidocaine w/ epinephrine 1-100,000 local infiltration Curettage performed in three different directions: Yes   Electrodesiccation performed over the curetted area: Yes   Final wound size (cm):  1.4 Hemostasis achieved with:  pressure, aluminum chloride and electrodesiccation Outcome: patient tolerated procedure well with no complications   Post-procedure details: wound care instructions given   Post-procedure details comment:  Ointment and  bandage applied.  Biopsy proven.   EDC performed today.   Viral warts, unspecified type (4) Right Forearm Distal x 1 (bx proven), R forearm x 2, L forearm x 1  Vs Prurigo Nodules. Biopsy proven of the right distal forearm.   Viral Wart (HPV) Counseling  Discussed viral / HPV (Human Papilloma Virus) etiology and risk of spread /infectivity to other areas of body as well as to other people.  Multiple treatments and methods may be required to clear warts and it is possible treatment may not be successful.  Treatment risks include discoloration; scarring and there is still potential for wart recurrence.  Destruction of lesion - Right Forearm Distal x 1 (bx proven), R forearm x 2, L forearm x 1 (4)  Destruction method: cryotherapy   Informed consent: discussed and consent obtained   Lesion destroyed using liquid nitrogen: Yes   Region frozen until ice ball extended beyond lesion: Yes   Outcome: patient tolerated procedure well with no complications   Post-procedure details: wound care instructions given   Additional details:  Prior to procedure, discussed risks of blister formation, small wound, skin dyspigmentation, or rare scar following cryotherapy. Recommend Vaseline ointment to treated areas while healing.     LICHEN SIMPLEX CHRONICUS/PRURIGO NODULARIS Exam: Healing excoriations with surrounding erythema and mild induration at left hand dorsum, right thumb, right 5th mcp   Chronic and persistent condition with duration or expected duration over one year. Condition is symptomatic/ bothersome to patient. Improving but not currently at goal.   Lichen simplex chronicus [facility]) is a persistent itchy area of thickened skin that is induced by chronic rubbing and/or scratching (chronic dermatitis).  These areas may be pink, hyperpigmented and may have excoriations and bumps (prurigo nodules- PN).  LSC/PN is commonly observed in uncontrolled atopic dermatitis and other forms of eczema, and in  other itchy skin conditions (eg, insect bites, scabies).  Sometimes it is not possible to know initial cause of LSC/PN if it has been present for a long time.  It generally responds well to treatment with high potency topical steroids.  It is important to stop rubbing/scratching the area in order to break the itch-scratch-rash-itch cycle, in order for the rash to resolve.   Treatment Plan: Continue clobetasol ointment once to twice daily to AA. Avoid applying to face, groin, and axilla. Use as directed. Long-term use can cause thinning of the skin.   Avoid picking/rubbing/scratching  Will hold off on Nemluvio for now since he is improving with topical clobetasol  Return 4-6 weeks, for warts vs LSC/PN. Recheck SCC site  I, Cherlyn Labella, CMA, am acting as scribe for Willeen Niece, MD .   Documentation: I have reviewed the above documentation for accuracy and completeness, and I agree with the above.  Willeen Niece, MD

## 2023-02-14 ENCOUNTER — Encounter: Payer: BC Managed Care – PPO | Admitting: Dermatology

## 2023-03-09 ENCOUNTER — Ambulatory Visit: Payer: BC Managed Care – PPO | Admitting: Dermatology

## 2023-10-30 ENCOUNTER — Other Ambulatory Visit: Payer: Self-pay

## 2023-10-30 ENCOUNTER — Emergency Department

## 2023-10-30 ENCOUNTER — Emergency Department
Admission: EM | Admit: 2023-10-30 | Discharge: 2023-10-30 | Disposition: A | Discharge status: Home / Self Care | Attending: Emergency Medicine | Admitting: Emergency Medicine

## 2023-10-30 DIAGNOSIS — S61227A Laceration with foreign body of left little finger without damage to nail, initial encounter: Secondary | ICD-10-CM | POA: Insufficient documentation

## 2023-10-30 DIAGNOSIS — Y990 Civilian activity done for income or pay: Secondary | ICD-10-CM | POA: Insufficient documentation

## 2023-10-30 DIAGNOSIS — S60947A Unspecified superficial injury of left little finger, initial encounter: Secondary | ICD-10-CM | POA: Diagnosis present

## 2023-10-30 DIAGNOSIS — W312XXA Contact with powered woodworking and forming machines, initial encounter: Secondary | ICD-10-CM | POA: Insufficient documentation

## 2023-10-30 DIAGNOSIS — S61317A Laceration without foreign body of left little finger with damage to nail, initial encounter: Secondary | ICD-10-CM

## 2023-10-30 DIAGNOSIS — S62667B Nondisplaced fracture of distal phalanx of left little finger, initial encounter for open fracture: Secondary | ICD-10-CM | POA: Diagnosis not present

## 2023-10-30 MED ORDER — CEPHALEXIN 500 MG PO CAPS: 500.0000 mg | ORAL_CAPSULE | Freq: Four times a day (QID) | ORAL | 0 refills | Status: AC

## 2023-10-30 MED ORDER — LIDOCAINE HCL (PF) 1 % IJ SOLN
5.0000 mL | Freq: Once | INTRAMUSCULAR | Status: AC
  Administered 2023-10-30: 5 mL
  Filled 2023-10-30: qty 5

## 2023-10-30 MED ORDER — CEFAZOLIN SODIUM-DEXTROSE 2-4 GM/100ML-% IV SOLN
2.0000 g | INTRAVENOUS | Status: AC
  Administered 2023-10-30: 2 g via INTRAVENOUS
  Filled 2023-10-30: qty 100

## 2023-10-30 MED ORDER — OXYCODONE HCL 5 MG PO TABS: 2.5000 mg | ORAL_TABLET | Freq: Four times a day (QID) | ORAL | 0 refills | Status: AC | PRN

## 2023-10-30 NOTE — ED Provider Notes (Signed)
 [place] EMERGENCY DEPARTMENT AT [facility] REGION[facility] Provider Note   CSN: [id number] Arrival date & time: 10/30/23  1428     Patient presents with: Laceration   Ronnie Cohen is a 58 y.o. male.  With history of cirrhosis of the liver presents to the emergency department for evaluation of left little finger laceration with a tablesaw.  Patient suffered a laceration along the dorsum of the DIP joint of the left little finger into the top of the nail and the proximal nail fold.  His tetanus is up-to-date.  Pain control.  Sensation is intact distally    Prior to Admission medications   Medication Sig Start Date End Date Taking? Authorizing Provider  cephALEXin  (KEFLEX ) 500 MG capsule Take 1 capsule (500 mg total) by mouth 4 (four) times daily for 7 days. 10/30/23 11/06/23 Yes Charlene Debby BROCKS, PA-C  oxyCODONE  (ROXICODONE ) 5 MG immediate release tablet Take 0.5-1 tablets (2.5-5 mg total) by mouth every 6 (six) hours as needed. 10/30/23 10/29/24 Yes Charlene Debby BROCKS, PA-C  ALPRAZolam  (XANAX ) 0.25 MG tablet Take 1 tablet by mouth daily as needed. 10/10/14   [provider]  ciclopirox  (LOPROX ) 0.77 % cream Apply topically 2 (two) times daily. Apply to feet, in between toes until clear then one more week and then once weekly for maintenance. 11/06/20   Moye, Virginia , MD  clobetasol  ointment (TEMOVATE ) 0.05 % Apply 1 Application topically 2 (two) times daily. Cover with bandaid 11/22/22   Jackquline Sawyer, MD  Dupilumab  (DUPIXENT ) 300 MG/2ML SOPN Inject 300 mg into the skin every 14 (fourteen) days. Starting at day 15 for maintenance. 07/14/22   Moye, Virginia , MD  folic acid  (FOLVITE ) 1 MG tablet Take 1 mg by mouth daily.    [provider]  furosemide  (LASIX ) 40 MG tablet Take 1 tablet (40 mg total) by mouth daily. 10/19/14   Tobie Press, MD  magnesium oxide (MAG-OX) 400 MG tablet Take by mouth.    [provider]  Multiple Vitamin (MULTIVITAMIN WITH MINERALS) TABS tablet Take  1 tablet by mouth daily.    [provider]  Multiple Vitamins-Minerals (SUPER THERA VITE M) TABS Take by mouth.    [provider]  mupirocin  ointment (BACTROBAN ) 2 % Apply 1 Application topically daily. Apply once daily to open areas and cover with band aid. 12/29/22   Jackquline Sawyer, MD  nadolol (CORGARD) 20 MG tablet Take by mouth. 02/28/17 02/28/18  [provider]  Nemolizumab-ilto  (NEMLUVIO ) 30 MG AUIJ Inject 2 Pens into the skin every 30 (thirty) days. 11/22/22   Jackquline Sawyer, MD  NONFORMULARY OR COMPOUNDED ITEM [facility]:  Onychomycosis nail lacquer - fluconazole 2%, terbinafine 1%, DMSO apply to affected areas daily 07/21/17   Evans, Brent M, DPM  omeprazole (PRILOSEC) 20 MG capsule Take by mouth. 02/28/17 02/28/18  [provider]  potassium chloride  (K-DUR) 10 MEQ tablet Take 1 tablet (10 mEq total) by mouth daily. 10/19/14   Tobie Press, MD  Ruxolitinib Phosphate  (OPZELURA ) 1.5 % CREA Apply 1 application. topically See admin instructions. Use 1 - 2 times daily to affected areas at arms, legs, back 06/01/21   Hester Alm BROCKS, MD  sertraline (ZOLOFT) 100 MG tablet Take 100 mg by mouth daily. 06/28/22   [provider]  spironolactone  (ALDACTONE ) 50 MG tablet Take 1 tablet (50 mg total) by mouth daily. 10/19/14   Tobie Press, MD  tacrolimus  (PROTOPIC ) 0.1 % ointment APPLY DAILY AND COVER WITH BAND AID. Patient not taking: Reported on  07/14/2022 03/29/22   Moye, Virginia , MD  thiamine  100 MG tablet Take 100 mg by mouth daily.    [provider]  triamcinolone  ointment (KENALOG ) 0.1 % APPLY TO AFFECTED SKIN AT BEDTIME AND COVER WITH A BANDAID 08/02/22   Moye, Virginia , MD  vitamin A 10000 UNIT capsule Take 10,000 Units by mouth daily.    [provider]    Allergies: Patient has no known allergies.    Review of Systems  Updated Vital Signs BP (!) 159/109   Pulse 74   Temp 98 F (36.7 C) (Oral)   Resp 18   SpO2 96%    Physical Exam Constitutional:      Appearance: He is well-developed.  HENT:     Head: Normocephalic and atraumatic.  Eyes:     Conjunctiva/sclera: Conjunctivae normal.  Cardiovascular:     Rate and Rhythm: Normal rate.  Pulmonary:     Effort: Pulmonary effort is normal. No respiratory distress.  Musculoskeletal:        General: Normal range of motion.     Cervical back: Normal range of motion.     Comments: Left little finger with laceration beginning along the distal middle phalanx of the left little finger extending dorsally to the nailbed and through the proximal nail fold.  Patient is able to maintain full active extension as well as flexion of the DIP joint and PIP joint.  Sensation is intact distally.  No foreign body.  Skin:    General: Skin is warm.     Findings: No rash.  Neurological:     Mental Status: He is alert and oriented to person, place, and time.  Psychiatric:        Behavior: Behavior normal.        Thought Content: Thought content normal.      (all labs ordered are listed, but only abnormal results are displayed) Labs Reviewed - No data to display  EKG: None  Radiology: DG Finger Little Left Result Date: 10/30/2023 CLINICAL DATA:  Laceration to the left small finger EXAM: LEFT FINGER(S) - 3 VIEW COMPARISON:  None Available. FINDINGS: Obliquely oriented soft tissue laceration of the distal small finger with associated comminuted fracture to the distal phalanx. Cortical irregularity of the ulnar aspect of the distal middle phalanx. No acute dislocation. IMPRESSION: 1. Obliquely oriented soft tissue laceration of the distal small finger with associated comminuted fracture to the distal phalanx. 2. Cortical irregularity of the ulnar aspect of the distal middle phalanx, which may represent an additional site of fracture. Electronically Signed   By: Limin  Xu M.D.   On: 10/30/2023 15:29     .Laceration Repair  Date/Time: 10/30/2023 4:36 PM  Performed by:  Charlene Debby BROCKS, PA-C Authorized by: Charlene Debby BROCKS, PA-C   Consent:    Consent obtained:  Verbal   Consent given by:  Patient Universal protocol:    Patient identity confirmed:  Verbally with patient Anesthesia:    Anesthesia method:  Nerve block   Block location:  Digital block left little finger   Block needle gauge:  25 G   Block anesthetic:  Lidocaine  1% w/o epi   Block injection procedure:  Anatomic landmarks identified   Block outcome:  Anesthesia achieved Laceration details:    Location:  Finger   Finger location:  L small finger   Length (cm):  4 Exploration:    Imaging obtained: x-ray   Treatment:    Area cleansed with:  Povidone-iodine and saline  Irrigation solution:  Sterile saline   Irrigation method:  Pressure wash Skin repair:    Repair method:  Sutures   Suture size:  5-0   Suture material:  Fast-absorbing gut   Suture technique:  Simple interrupted   Number of sutures:  8 Approximation:    Approximation:  Close Repair type:    Repair type:  Simple Post-procedure details:    Dressing:  Bulky dressing and splint for protection   Procedure completion:  Tolerated Comments:     Laceration occurred through the proximal nail fold and nailbed.  Nail was removed in 2 pieces.  A repair was made to the nailbed and the nail was reinserted to the proximal nail fold and tacked down with dissolvable sutures.    Medications Ordered in the ED  lidocaine  (PF) (XYLOCAINE ) 1 % injection 5 mL (5 mLs Infiltration Given 10/30/23 1525)  ceFAZolin  (ANCEF ) IVPB 2g/100 mL premix (0 g Intravenous Stopped 10/30/23 1619)                                    Medical Decision Making Amount and/or Complexity of Data Reviewed Radiology: ordered.  Risk Prescription drug management.   59 year old male with left little finger laceration.  Laceration did disrupt the ulnar aspect of the DIP joint.  Fracture extended into the distal phalanx.  Fracture across the proximal nail fold  and into the nailbed.  Wound was thoroughly irrigated and cleansed Ihde after successful digital block.  Wound was soaked in Betadine and saline.  Patient was given IV antibiotics nail bed was repaired with dissolvable sutures.  Nail was replaced back into the proximal nail fold.  Wound was then covered with Vaseline gauze, Kerlix, Coban and aluminum foam splint.  Patient will follow-up with orthopedics.  They do have a hand specialist that the family has seen in the past and intend to follow-up with them.  Final diagnoses:  Open nondisplaced fracture of distal phalanx of left little finger, initial encounter  Laceration of left little finger without foreign body with damage to nail, initial encounter    ED Discharge Orders          Ordered    cephALEXin  (KEFLEX ) 500 MG capsule  4 times daily        10/30/23 1633    oxyCODONE  (ROXICODONE ) 5 MG immediate release tablet  Every 6 hours PRN        10/30/23 1633               Charlene Debby BROCKS, PA-C 10/30/23 1640    Levander Slate, MD 10/30/23 1728

## 2023-10-30 NOTE — ED Triage Notes (Signed)
 Pt to ED via POV from home. Pt was working on [employer] and machine caught left pinky finger. Bleeding controlled.

## 2023-10-30 NOTE — Discharge Instructions (Signed)
 Please keep laceration site clean covered and dry.  Please keep bulky dressing with aluminum foam splint on until you follow-up with orthopedics/hand specialist

## 2023-11-14 NOTE — Therapy (Deleted)
 OUTPATIENT OCCUPATIONAL THERAPY ORTHO EVALUATION  Patient Name: Ronnie Cohen MRN: [id number] DOB:[birth year], 58 y.o., male Today's Date: 11/14/2023  PCP: Dr Cleotilde MART PROVIDER: Dr Ezra  END OF SESSION:   Past Medical History:  Diagnosis Date   Anxiety    Cirrhosis (HCC) 10/18/2014   Hypertension    Squamous cell carcinoma of skin 12/29/2022   Right Forearm Proximal, EDC 02/01/23   Vertigo    No past surgical history on file. Patient Active Problem List   Diagnosis Date Noted   Alcoholic cirrhosis of liver with ascites (HCC) 11/12/2015   Cirrhosis (HCC) 10/18/2014   Ascites 10/17/2014    ONSET DATE: 10/30/23  REFERRING DIAG: ***  THERAPY DIAG:  No diagnosis found.  Rationale for Evaluation and Treatment: Rehabilitation  SUBJECTIVE:   SUBJECTIVE STATEMENT: *** Pt accompanied by: self  PERTINENT HISTORY: 11/10/23 Left Upper Extremity: The wound is clean, dry, and intact. No significant erythema, drainage. Xeroform over the nail plate. Volarly there is a small flap of skin that appears dusky. He is able to demonstrate active range of motion with flexion and extension of the DIP. He has about a 2 to 3 cm tip to palm distance with attempted fist formation. brisk cap refill.  Imaging and Results: Radiographs of the left small finger obtained today demonstrate similar findings to previous x-rays have a distal phalanx fracture minimally displaced. There is some early callus formation. Overall position, similar to previous radiographs.  I personally interpreted any radiographs taken during today's visit.  Assessment & Plan: Ronnie Cohen is a 58 y.o. male patient with Left small finger nailbed injury and distal phalanx fracture - Will place referral to OT for custom protective splint fabrication, wound care modalities, ROM - Continue dressing changes as needed - Continue to work on range of motion of the digit - F/u in 4 wks  Jackquline CANDIE Ezra,  MD [facility] [address] [place], [place] [address] Phone: [phone]   PRECAUTIONS: surgeon instuctions    WEIGHT BEARING RESTRICTIONS: No  PAIN:  Are you having pain? {OPRCPAIN:27236}  FALLS: Has patient fallen in last 6 months? {fallsyesno:27318}  LIVING ENVIRONMENT: Lives with: {OPRC lives with:25569::lives with their family}   PLOF: {PLOF:24004}  PATIENT GOALS: ***  NEXT MD VISIT: ***  OBJECTIVE:  Note: Objective measures were completed at Evaluation unless otherwise noted.  HAND DOMINANCE: Right  ADLs: {ADLs OT:31716}  FUNCTIONAL OUTCOME MEASURES: {OTFUNCTIONALMEASURES:27238}  UPPER EXTREMITY ROM:     Active ROM Right eval Left eval  Shoulder flexion    Shoulder abduction    Shoulder adduction    Shoulder extension    Shoulder internal rotation    Shoulder external rotation    Elbow flexion    Elbow extension    Wrist flexion    Wrist extension    Wrist ulnar deviation    Wrist radial deviation    Wrist pronation    Wrist supination    (Blank rows = not tested)  {AROM/PROM:27142} ROM Right eval Left eval  Thumb MCP (0-60)    Thumb IP (0-80)    Thumb Radial abd/add (0-55)     Thumb Palmar abd/add (0-45)     Thumb Opposition to Small Finger     Index MCP (0-90)     Index PIP (0-100)     Index DIP (0-70)      Long MCP (0-90)      Long PIP (0-100)      Long DIP (0-70)  Ring MCP (0-90)      Ring PIP (0-100)      Ring DIP (0-70)      Little MCP (0-90)      Little PIP (0-100)      Little DIP (0-70)      (Blank rows = not tested)    HAND FUNCTION: Grip strength: Right: *** lbs; Left: *** lbs, Lateral pinch: Right: *** lbs, Left: *** lbs, and 3 point pinch: Right: *** lbs, Left: *** lbs  COORDINATION: {otcoordination:27237}  SENSATION: {sensation:27233}  EDEMA: ***  COGNITION: Overall cognitive status: Within functional limits for tasks assessed       TREATMENT DATE:  11/15/23                                                                                                                            Modalities: {OPRCMODALITIES:31717}     PATIENT EDUCATION: Education details: findings of eval and HEP  Person educated: Patient Education method: Explanation, Demonstration, Tactile cues, Verbal cues, and Handouts Education comprehension: verbalized understanding, returned demonstration, verbal cues required, and needs further education    GOALS: Goals reviewed with patient? Yes   LONG TERM GOALS: Target date: ***  *** Baseline:  Goal status: INITIAL  2.  *** Baseline:  Goal status: INITIAL  3.  *** Baseline:  Goal status: INITIAL  4.  *** Baseline:  Goal status: INITIAL  5.  *** Baseline:  Goal status: INITIAL  6.  *** Baseline:  Goal status: INITIAL  ASSESSMENT:  CLINICAL IMPRESSION: Patient seen today for occupational therapy evaluation for ***.   PERFORMANCE DEFICITS: in functional skills including ADLs, IADLs, ROM, strength, pain, flexibility, decreased knowledge of use of DME, and UE functional use,   and psychosocial skills including environmental adaptation and routines and behaviors.   IMPAIRMENTS: are limiting patient from ADLs, IADLs, rest and sleep, play, leisure, and social participation.   COMORBIDITIES: has no other co-morbidities that affects occupational performance. Patient will benefit from skilled OT to address above impairments and improve overall function.  MODIFICATION OR ASSISTANCE TO COMPLETE EVALUATION: No modification of tasks or assist necessary to complete an evaluation.  OT OCCUPATIONAL PROFILE AND HISTORY: Problem focused assessment: Including review of records relating to presenting problem.  CLINICAL DECISION MAKING: LOW - limited treatment options, no task modification necessary  REHAB POTENTIAL: Good for goals  EVALUATION COMPLEXITY: Low      PLAN:  OT FREQUENCY: 1-2x/week  OT  DURATION: 12 weeks  PLANNED INTERVENTIONS: 97168 OT Re-evaluation, 97535 self care/ADL training, 02889 therapeutic exercise, 97530 therapeutic activity, 97112 neuromuscular re-education, 97140 manual therapy, 97018 paraffin, 02960 fluidotherapy, 97034 contrast bath, scar mobilization, passive range of motion, patient/family education, and DME and/or AE instructions    CONSULTED AND AGREED WITH PLAN OF CARE: Patient     Ancel Peters, OTR/L,CLT 11/14/2023, 1:26 PM

## 2023-11-15 ENCOUNTER — Ambulatory Visit: Admitting: Occupational Therapy
